# Patient Record
Sex: Male | Born: 1960 | Race: White | Hispanic: No | Marital: Married | Smoking: Never smoker
Health system: Southern US, Community
[De-identification: ages and names within clinical notes are randomized; demographics above are authoritative.]

## PROBLEM LIST (undated history)

## (undated) DIAGNOSIS — I341 Nonrheumatic mitral (valve) prolapse: Secondary | ICD-10-CM

## (undated) DIAGNOSIS — D689 Coagulation defect, unspecified: Secondary | ICD-10-CM

## (undated) DIAGNOSIS — B958 Unspecified staphylococcus as the cause of diseases classified elsewhere: Secondary | ICD-10-CM

## (undated) DIAGNOSIS — N200 Calculus of kidney: Secondary | ICD-10-CM

## (undated) DIAGNOSIS — M1990 Unspecified osteoarthritis, unspecified site: Secondary | ICD-10-CM

## (undated) HISTORY — PX: POLYPECTOMY: SHX149

## (undated) HISTORY — DX: Unspecified osteoarthritis, unspecified site: M19.90

## (undated) HISTORY — DX: Unspecified staphylococcus as the cause of diseases classified elsewhere: B95.8

## (undated) HISTORY — PX: STOMACH SURGERY: SHX791

## (undated) HISTORY — DX: Coagulation defect, unspecified: D68.9

## (undated) HISTORY — PX: LUMBAR LAMINECTOMY: SHX95

## (undated) HISTORY — DX: Calculus of kidney: N20.0

## (undated) HISTORY — PX: APPENDECTOMY: SHX54

## (undated) HISTORY — DX: Nonrheumatic mitral (valve) prolapse: I34.1

## (undated) HISTORY — PX: COLONOSCOPY: SHX174

---
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
## 1996-11-26 ENCOUNTER — Encounter: Payer: Self-pay | Admitting: Gastroenterology

## 1997-01-20 ENCOUNTER — Encounter: Payer: Self-pay | Admitting: Gastroenterology

## 2000-01-03 ENCOUNTER — Ambulatory Visit (HOSPITAL_COMMUNITY): Admission: RE | Admit: 2000-01-03 | Discharge: 2000-01-03 | Payer: Self-pay | Admitting: Family Medicine

## 2000-01-03 ENCOUNTER — Encounter: Payer: Self-pay | Admitting: Family Medicine

## 2000-03-05 ENCOUNTER — Other Ambulatory Visit: Admission: RE | Admit: 2000-03-05 | Discharge: 2000-03-05 | Payer: Self-pay | Admitting: Gastroenterology

## 2000-03-05 ENCOUNTER — Encounter: Payer: Self-pay | Admitting: Gastroenterology

## 2000-03-05 ENCOUNTER — Encounter (INDEPENDENT_AMBULATORY_CARE_PROVIDER_SITE_OTHER): Payer: Self-pay

## 2001-10-14 ENCOUNTER — Encounter: Payer: Self-pay | Admitting: Gastroenterology

## 2001-11-11 ENCOUNTER — Encounter: Payer: Self-pay | Admitting: Neurosurgery

## 2001-11-18 ENCOUNTER — Encounter: Payer: Self-pay | Admitting: Neurosurgery

## 2001-11-18 ENCOUNTER — Ambulatory Visit (HOSPITAL_COMMUNITY): Admission: RE | Admit: 2001-11-18 | Discharge: 2001-11-18 | Payer: Self-pay | Admitting: Neurosurgery

## 2005-10-01 ENCOUNTER — Ambulatory Visit: Payer: Self-pay | Admitting: Gastroenterology

## 2005-10-16 ENCOUNTER — Ambulatory Visit: Payer: Self-pay | Admitting: Gastroenterology

## 2005-10-16 ENCOUNTER — Encounter (INDEPENDENT_AMBULATORY_CARE_PROVIDER_SITE_OTHER): Payer: Self-pay | Admitting: Specialist

## 2006-11-12 HISTORY — PX: LAPAROSCOPIC APPENDECTOMY: SUR753

## 2007-04-22 ENCOUNTER — Ambulatory Visit (HOSPITAL_COMMUNITY): Admission: RE | Admit: 2007-04-22 | Discharge: 2007-04-22 | Payer: Self-pay | Admitting: Family Medicine

## 2007-04-22 ENCOUNTER — Encounter (INDEPENDENT_AMBULATORY_CARE_PROVIDER_SITE_OTHER): Payer: Self-pay | Admitting: Family Medicine

## 2007-04-22 ENCOUNTER — Ambulatory Visit: Payer: Self-pay | Admitting: Vascular Surgery

## 2007-05-08 ENCOUNTER — Ambulatory Visit: Payer: Self-pay | Admitting: Oncology

## 2007-05-14 LAB — CBC WITH DIFFERENTIAL/PLATELET
BASO%: 0.3 % (ref 0.0–2.0)
Basophils Absolute: 0 10*3/uL (ref 0.0–0.1)
EOS%: 2.1 % (ref 0.0–7.0)
Eosinophils Absolute: 0.1 10*3/uL (ref 0.0–0.5)
HGB: 15.1 g/dL (ref 13.0–17.1)
LYMPH%: 22.6 % (ref 14.0–48.0)
MCH: 30.4 pg (ref 28.0–33.4)
MCHC: 35.2 g/dL (ref 32.0–35.9)
MONO#: 0.6 10*3/uL (ref 0.1–0.9)
RBC: 4.98 10*6/uL (ref 4.20–5.71)
RDW: 12.9 % (ref 11.2–14.6)
WBC: 7.2 10*3/uL (ref 4.0–10.0)
lymph#: 1.6 10*3/uL (ref 0.9–3.3)

## 2007-05-14 LAB — MORPHOLOGY: PLT EST: ADEQUATE

## 2007-11-13 HISTORY — PX: LITHOTRIPSY: SUR834

## 2008-04-20 ENCOUNTER — Encounter (INDEPENDENT_AMBULATORY_CARE_PROVIDER_SITE_OTHER): Payer: Self-pay | Admitting: Family Medicine

## 2008-04-20 ENCOUNTER — Ambulatory Visit: Payer: Self-pay | Admitting: Surgery

## 2008-04-20 ENCOUNTER — Ambulatory Visit (HOSPITAL_COMMUNITY): Admission: RE | Admit: 2008-04-20 | Discharge: 2008-04-20 | Payer: Self-pay | Admitting: Family Medicine

## 2009-03-15 ENCOUNTER — Encounter (INDEPENDENT_AMBULATORY_CARE_PROVIDER_SITE_OTHER): Payer: Self-pay | Admitting: General Surgery

## 2009-03-15 ENCOUNTER — Encounter: Admission: RE | Admit: 2009-03-15 | Discharge: 2009-03-15 | Payer: Self-pay | Admitting: Family Medicine

## 2009-03-15 ENCOUNTER — Encounter: Payer: Self-pay | Admitting: Gastroenterology

## 2009-03-15 ENCOUNTER — Inpatient Hospital Stay (HOSPITAL_COMMUNITY): Admission: EM | Admit: 2009-03-15 | Discharge: 2009-03-16 | Payer: Self-pay | Admitting: Emergency Medicine

## 2009-07-20 ENCOUNTER — Encounter: Payer: Self-pay | Admitting: Gastroenterology

## 2009-07-27 ENCOUNTER — Telehealth: Payer: Self-pay | Admitting: Gastroenterology

## 2009-08-04 ENCOUNTER — Ambulatory Visit (HOSPITAL_COMMUNITY): Admission: RE | Admit: 2009-08-04 | Discharge: 2009-08-04 | Payer: Self-pay | Admitting: Urology

## 2009-08-26 ENCOUNTER — Encounter (INDEPENDENT_AMBULATORY_CARE_PROVIDER_SITE_OTHER): Payer: Self-pay | Admitting: *Deleted

## 2009-08-26 ENCOUNTER — Ambulatory Visit: Payer: Self-pay | Admitting: Gastroenterology

## 2009-08-26 DIAGNOSIS — K5120 Ulcerative (chronic) proctitis without complications: Secondary | ICD-10-CM

## 2009-08-26 DIAGNOSIS — R933 Abnormal findings on diagnostic imaging of other parts of digestive tract: Secondary | ICD-10-CM

## 2009-08-26 DIAGNOSIS — D3A026 Benign carcinoid tumor of the rectum: Secondary | ICD-10-CM

## 2009-08-30 ENCOUNTER — Encounter: Admission: RE | Admit: 2009-08-30 | Discharge: 2009-08-30 | Payer: Self-pay | Admitting: Gastroenterology

## 2009-09-02 ENCOUNTER — Ambulatory Visit: Payer: Self-pay | Admitting: Gastroenterology

## 2009-09-02 ENCOUNTER — Encounter: Payer: Self-pay | Admitting: Gastroenterology

## 2009-09-05 ENCOUNTER — Encounter: Payer: Self-pay | Admitting: Gastroenterology

## 2010-02-16 ENCOUNTER — Telehealth: Payer: Self-pay | Admitting: Gastroenterology

## 2010-02-20 ENCOUNTER — Encounter: Admission: RE | Admit: 2010-02-20 | Discharge: 2010-02-20 | Payer: Self-pay | Admitting: Gastroenterology

## 2010-03-10 ENCOUNTER — Encounter: Admission: RE | Admit: 2010-03-10 | Discharge: 2010-03-10 | Payer: Self-pay | Admitting: Neurosurgery

## 2010-07-19 ENCOUNTER — Encounter: Admission: RE | Admit: 2010-07-19 | Discharge: 2010-07-19 | Payer: Self-pay | Admitting: Neurosurgery

## 2010-10-26 IMAGING — CR DG LUMBAR SPINE 2-3V
3 series · 3 of 3 positions shown · non-contrast
Comparison: CT abdomen pelvis of 03/15/2009

CLINICAL DATA: History of lumbar spine surgery in 3335, low back
pain

LUMBAR SPINE - 2-3 VIEW

[w l-spine lat (1 of 2)]
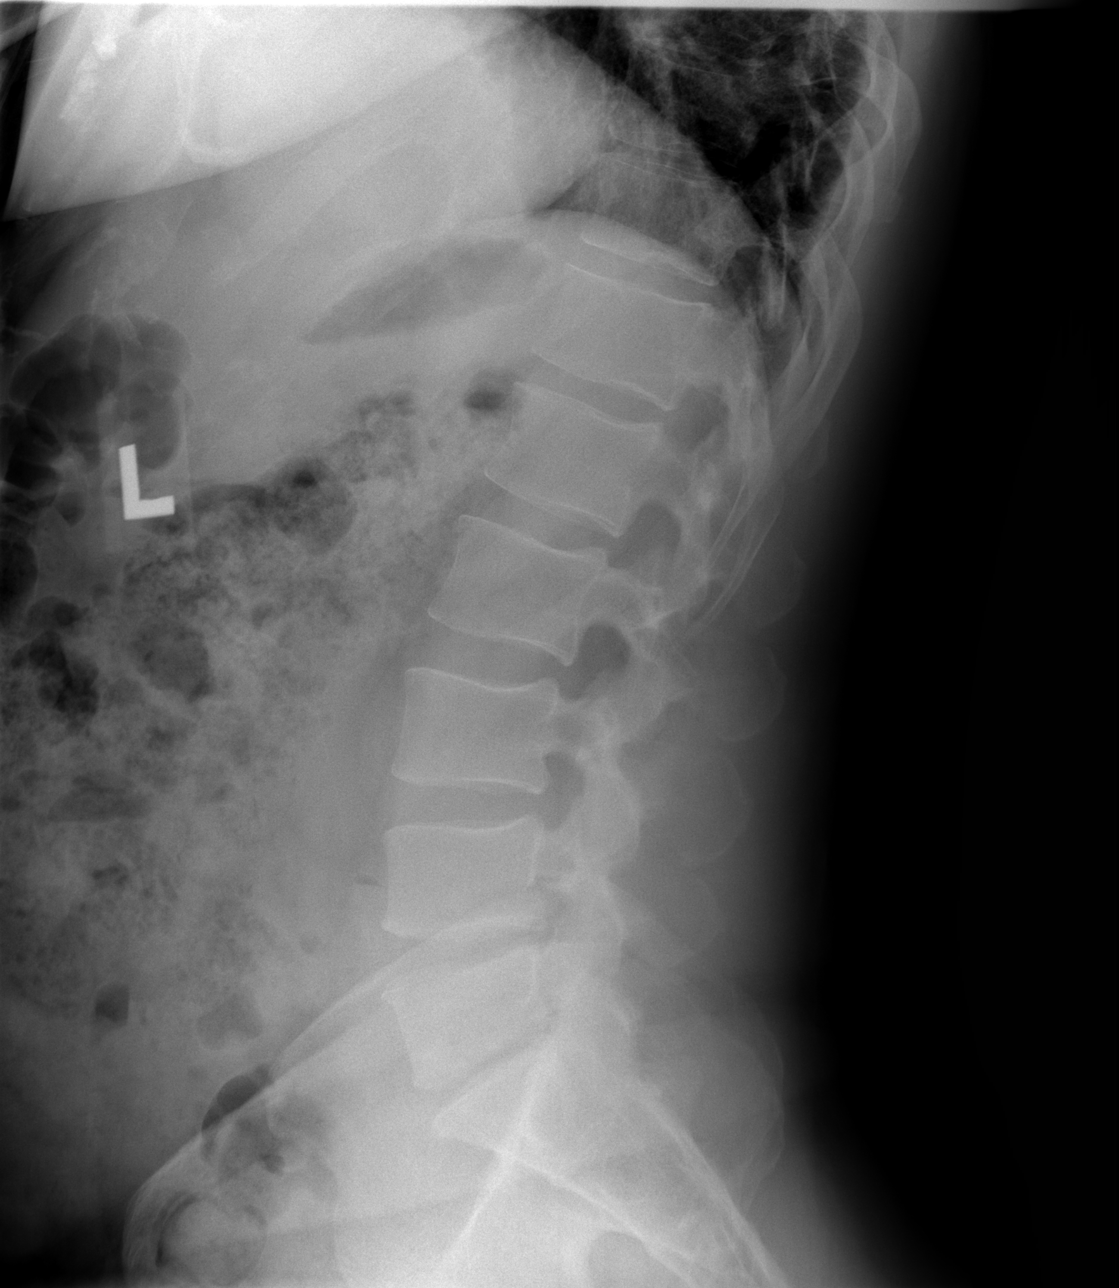

[w l-spine lat *]
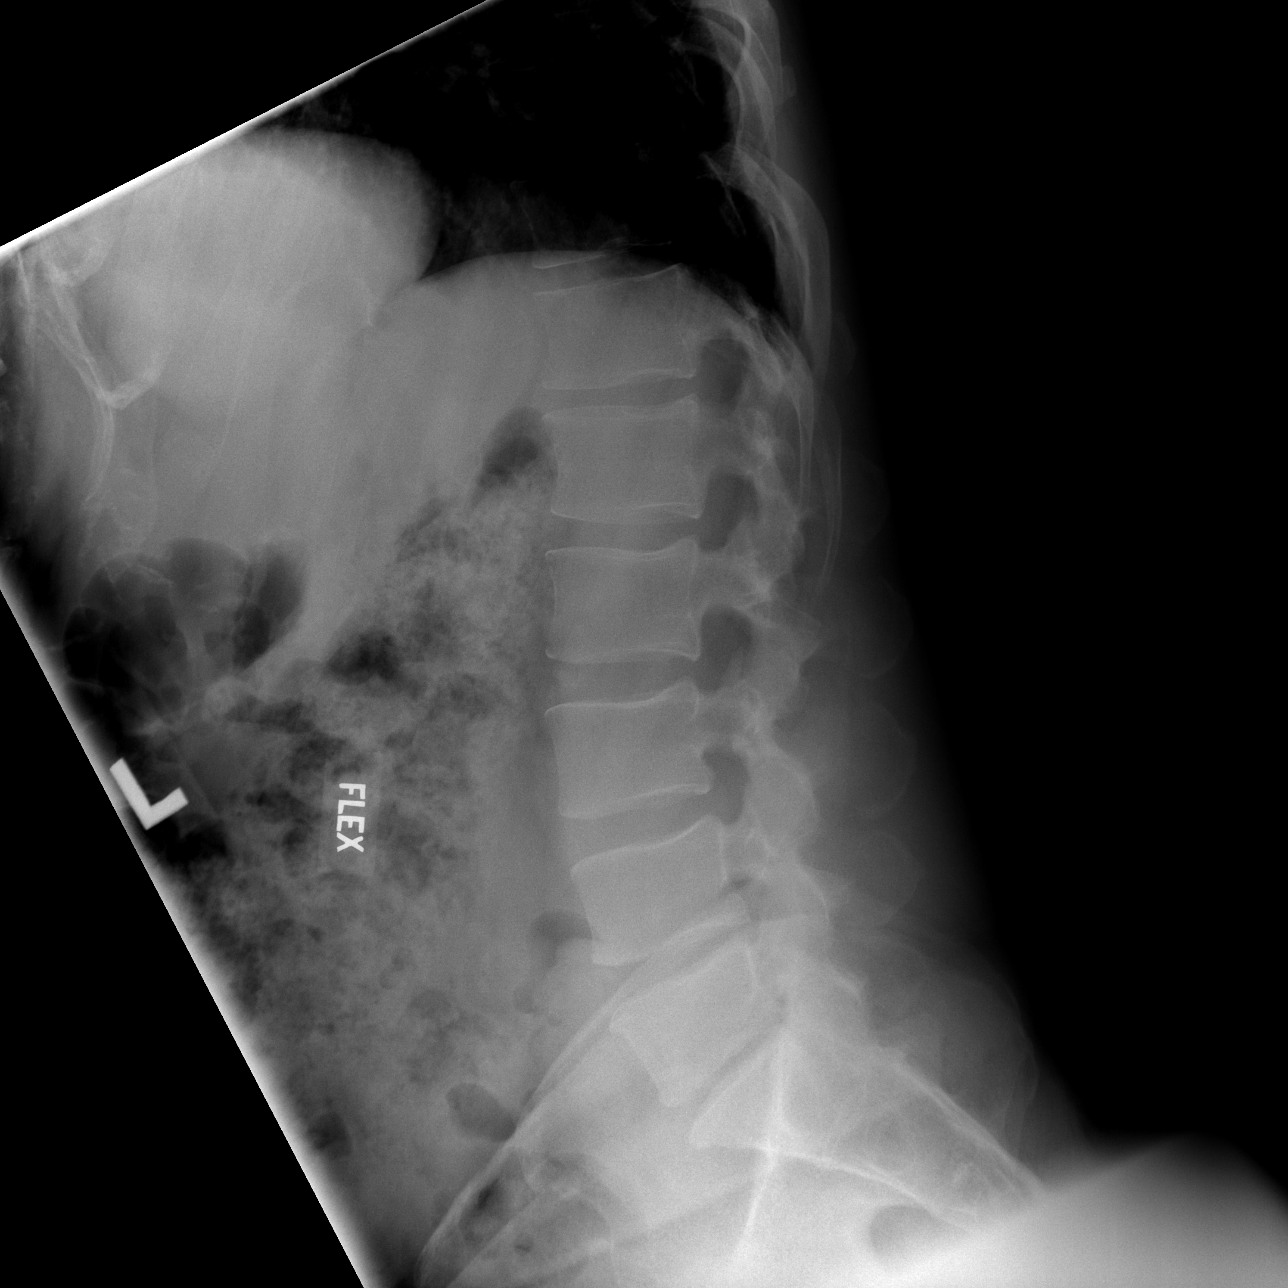

[w l-spine lat (2 of 2)]
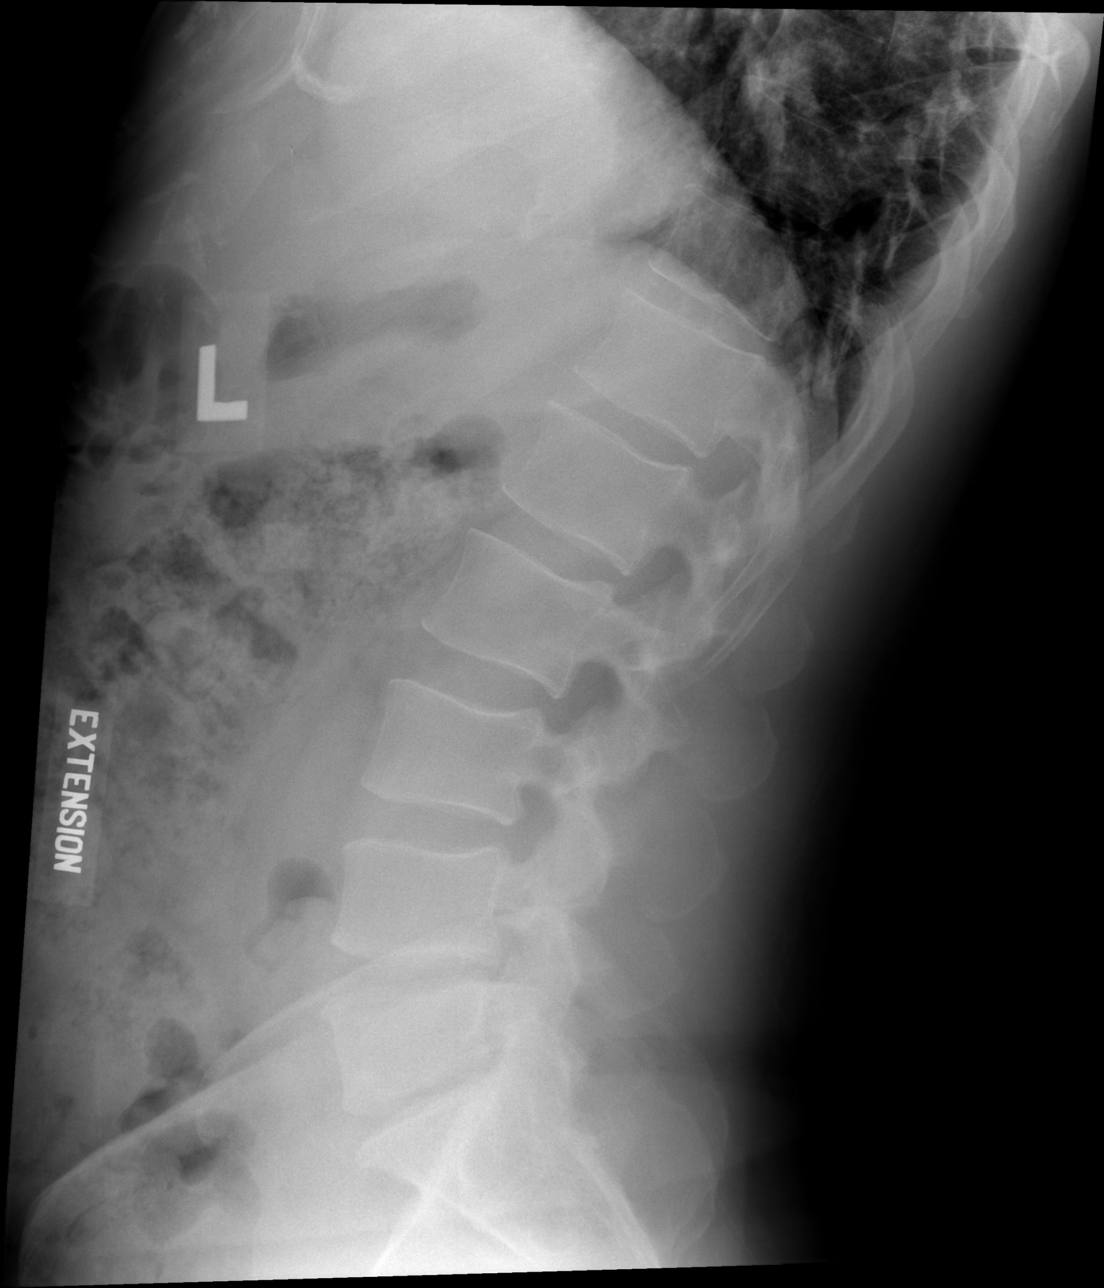

[3 of 3 positions shown; findings below may reference images not displayed]

FINDINGS: The lumbar vertebrae remain normal alignment.  Decreased
disc space at L5-S1 is unchanged.  No compression deformity is
seen.  Through flexion and extension range of motion is limited but
no malalignment is seen.
IMPRESSION: 1.  No change in alignment with decreased disc space remaining at
L5-S1.
2.  Limited range of motion through flexion and extension.  No
malalignment.

## 2010-11-08 ENCOUNTER — Inpatient Hospital Stay (HOSPITAL_COMMUNITY)
Admission: RE | Admit: 2010-11-08 | Discharge: 2010-11-11 | Payer: Self-pay | Source: Home / Self Care | Attending: Neurosurgery | Admitting: Neurosurgery

## 2010-12-12 ENCOUNTER — Encounter
Admission: RE | Admit: 2010-12-12 | Discharge: 2010-12-12 | Payer: Self-pay | Source: Home / Self Care | Attending: Neurosurgery | Admitting: Neurosurgery

## 2010-12-12 NOTE — Progress Notes (Signed)
Summary: Refax order  Phone Note From Other Clinic   Caller: [facility] [phone] Call For: Dr. Russella Dar Summary of Call: Refax ultrasound order  Fax# [phone] Initial call taken by: Karna Christmas,  February 16, 2010 12:12 PM  Follow-up for Phone Call        done Follow-up by: Darcey Nora RN, CGRN,  February 16, 2010 12:16 PM

## 2010-12-21 NOTE — Discharge Summary (Signed)
  [name], John White               ACCOUNT NO.:  [id number]  MEDICAL RECORD NO.:  [id number]          PATIENT TYPE:  INP  LOCATION:  3006                         FACILITY:  [facility]  PHYSICIAN:  Donalee Citrin, M.D.        DATE OF BIRTH:  [birth year]  DATE OF ADMISSION:  11/08/2010 DATE OF DISCHARGE:  11/11/2010                              DISCHARGE SUMMARY   ADMITTING DIAGNOSIS:  Degenerative disk disease, lumbar spinal stenosis L4-5 and L5-S1.  PROCEDURE:  L4-5 and L5 interbody fusion.  HOSPITAL COURSE:  The patient was admitted and immediately went to the operating room and underwent the aforementioned procedure.  Postop, the patient did very well, recovered on the floor.  On the floor, the patient continued to convalesce well, mobilized well with complete resolution of his preoperative leg pain, back pain came and had progressively better control.  He had a mild low-grade temperature and was given incentive spirometer.  His Hemovac was taken out on day 2, and at the time of discharge the patient was ambulating and voiding spontaneously.  He was discharged on oral oxycodone.  We will schedule follow up in approximately 1 week.          ______________________________ Donalee Citrin, M.D.     GC/MEDQ  D:  12/07/2010  T:  12/07/2010  Job:  [id number]  Electronically Signed by Donalee Citrin M.D. on 12/21/2010 08:51:30 AM

## 2011-01-02 ENCOUNTER — Ambulatory Visit
Admission: RE | Admit: 2011-01-02 | Discharge: 2011-01-02 | Disposition: A | Discharge status: Home / Self Care | Payer: Managed Care, Other (non HMO) | Source: Ambulatory Visit | Attending: Neurosurgery | Admitting: Neurosurgery

## 2011-01-02 ENCOUNTER — Other Ambulatory Visit: Payer: Self-pay | Admitting: Neurosurgery

## 2011-01-02 DIAGNOSIS — M545 Low back pain: Secondary | ICD-10-CM

## 2011-01-22 LAB — CBC
HCT: 42.4 % (ref 39.0–52.0)
MCHC: 34.9 g/dL (ref 30.0–36.0)
RDW: 12.8 % (ref 11.5–15.5)

## 2011-01-22 LAB — URINE CULTURE: Culture  Setup Time: 201112300629

## 2011-01-22 LAB — URINALYSIS, ROUTINE W REFLEX MICROSCOPIC
Bilirubin Urine: NEGATIVE
Glucose, UA: NEGATIVE mg/dL
Ketones, ur: NEGATIVE mg/dL
Nitrite: NEGATIVE
Specific Gravity, Urine: 1.016 (ref 1.005–1.030)
pH: 7 (ref 5.0–8.0)

## 2011-01-22 LAB — SURGICAL PCR SCREEN
MRSA, PCR: NEGATIVE
Staphylococcus aureus: POSITIVE — AB

## 2011-01-22 LAB — ABO/RH: ABO/RH(D): A POS

## 2011-02-19 ENCOUNTER — Telehealth: Payer: Self-pay

## 2011-02-19 DIAGNOSIS — R933 Abnormal findings on diagnostic imaging of other parts of digestive tract: Secondary | ICD-10-CM

## 2011-02-19 NOTE — Telephone Encounter (Signed)
Pt is notified that it is time to schedule 1 year follow up ultrasound.  He is scheduled for 03/01/11 8:00.  He is aware

## 2011-02-20 LAB — CBC
HCT: 42.4 % (ref 39.0–52.0)
Hemoglobin: 14.4 g/dL (ref 13.0–17.0)
MCHC: 33.9 g/dL (ref 30.0–36.0)
MCV: 90.1 fL (ref 78.0–100.0)
Platelets: 212 10*3/uL (ref 150–400)
RBC: 4.71 MIL/uL (ref 4.22–5.81)
RDW: 13.1 % (ref 11.5–15.5)
WBC: 7.4 10*3/uL (ref 4.0–10.5)

## 2011-02-20 LAB — URINALYSIS, ROUTINE W REFLEX MICROSCOPIC
Bilirubin Urine: NEGATIVE
Nitrite: NEGATIVE
Specific Gravity, Urine: 1.035 — ABNORMAL HIGH (ref 1.005–1.030)
Urobilinogen, UA: 1 mg/dL (ref 0.0–1.0)
pH: 7.5 (ref 5.0–8.0)

## 2011-02-20 LAB — DIFFERENTIAL
Basophils Absolute: 0 10*3/uL (ref 0.0–0.1)
Basophils Relative: 0 % (ref 0–1)
Eosinophils Absolute: 0 K/uL (ref 0.0–0.7)
Eosinophils Relative: 0 % (ref 0–5)
Lymphocytes Relative: 23 % (ref 12–46)
Lymphs Abs: 1.7 K/uL (ref 0.7–4.0)
Monocytes Absolute: 0.6 K/uL (ref 0.1–1.0)
Monocytes Relative: 9 % (ref 3–12)
Neutro Abs: 5 10*3/uL (ref 1.7–7.7)
Neutrophils Relative %: 68 % (ref 43–77)

## 2011-02-20 LAB — TYPE AND SCREEN
ABO/RH(D): A POS
Antibody Screen: NEGATIVE

## 2011-02-20 LAB — PREPARE RBC (CROSSMATCH)

## 2011-02-20 LAB — ABO/RH: ABO/RH(D): A POS

## 2011-02-20 LAB — URINE MICROSCOPIC-ADD ON

## 2011-02-22 ENCOUNTER — Other Ambulatory Visit (HOSPITAL_COMMUNITY): Payer: Managed Care, Other (non HMO)

## 2011-03-01 ENCOUNTER — Ambulatory Visit (HOSPITAL_COMMUNITY)
Admission: RE | Admit: 2011-03-01 | Discharge: 2011-03-01 | Disposition: A | Discharge status: Home / Self Care | Payer: Managed Care, Other (non HMO) | Source: Ambulatory Visit | Attending: Gastroenterology | Admitting: Gastroenterology

## 2011-03-01 DIAGNOSIS — R933 Abnormal findings on diagnostic imaging of other parts of digestive tract: Secondary | ICD-10-CM

## 2011-03-01 DIAGNOSIS — K824 Cholesterolosis of gallbladder: Secondary | ICD-10-CM | POA: Insufficient documentation

## 2011-03-02 ENCOUNTER — Telehealth: Payer: Self-pay | Admitting: Gastroenterology

## 2011-03-02 NOTE — Telephone Encounter (Signed)
Told pt that Dr. Russella Dar has not reviewed the ultrasound results yet but we will contact him as soon as he has reviewed and signed the report. Did tell him that there was nothing urgent to the report that needed to be addressed. Pt verbalized understanding.

## 2011-03-05 NOTE — Progress Notes (Signed)
I have left a voicemail with the patient with the results and that we will contact him next year to repeat US

## 2011-03-22 ENCOUNTER — Other Ambulatory Visit: Payer: Self-pay | Admitting: Neurosurgery

## 2011-03-22 ENCOUNTER — Ambulatory Visit
Admission: RE | Admit: 2011-03-22 | Discharge: 2011-03-22 | Disposition: A | Discharge status: Home / Self Care | Payer: Managed Care, Other (non HMO) | Source: Ambulatory Visit | Attending: Neurosurgery | Admitting: Neurosurgery

## 2011-03-22 DIAGNOSIS — M25559 Pain in unspecified hip: Secondary | ICD-10-CM

## 2011-03-22 DIAGNOSIS — M545 Low back pain: Secondary | ICD-10-CM

## 2011-03-27 NOTE — H&P (Signed)
NAMEGREGORI, John White               ACCOUNT NO.:  [id number]   MEDICAL RECORD NO.:  [id number]          PATIENT TYPE:  INP   LOCATION:  0114                         FACILITY:  [facility]   PHYSICIAN:  Anselm Pancoast. Weatherly, M.D.DATE OF BIRTH:  [birth year]   DATE OF ADMISSION:  03/15/2009  DATE OF DISCHARGE:                              HISTORY & PHYSICAL   CHIEF COMPLAINT:  Abdominal pain.   HISTORY OF PRESENT ILLNESS:  John White is a 50 year old Caucasian  male who was seen in the [facility] today by Dr. Wynelle Link.  CT obtained  which showed a remarkably enlarged appendix.  The patient states that  approximately a week ago he was awakened on Monday night, seven days  ago, with pain, went back to sleep, felt somewhat better the next day,  but then has had intermittent episodes of kind of cramping, more in the  upper abdomen, and then today the pain has kind of shifted to the lower  abdomen.  The patient's only previous surgery was a pilar myomotomy done  when he was 40 months old and otherwise he is in good health.  The  patient had a white count at [facility] and this was normal and he was  sent in.  He was seen originally by the ER physician and they felt that  he was not really acutely tender in the lower abdomen.  I examined the  patient and I find that he is tender with deep palpation in the right  lower quadrant and feel that he has kind of a chronic appendicitis.   ALLERGIES:  The patient has no allergies.   MEDICATIONS:  He is on no chronic medications.   SOCIAL HISTORY:  He works as Airline pilot for [employer] .   PHYSICAL EXAMINATION:  VITAL SIGNS:  His blood pressure was 128/91,  temperature was 98.4, pulse was 79, respirations 20.  HEENT:  Unremarkable.  LUNGS:  Clear.  CARDIAC:  Normal sinus rhythm.  ABDOMEN:  Abdomen was not muscle guarded in tenderness, but with deep  palpation of the right lower quadrant, he is definitely tender where he  is not on the left  lower quadrant.  He has got a well-healed low  transverse incision from his pilar myotomy as an infant and he is not  tender in the right upper quadrant.  CENTRAL NERVOUS SYSTEM:  Physiologic.   IMPRESSION:  Acute appendicitis, kind of chronic of one week's duration.   PLAN:  Will proceed with a laparoscopic appendectomy.  The patient has  been given 3 g of Zosyn with 3.375 g of Zosyn while in the ER.     Anselm Pancoast. Zachery Dakins, M.D.  Electronically Signed    WJW/MEDQ  D:  03/15/2009  T:  03/15/2009  Job:  [id number]

## 2011-03-27 NOTE — Op Note (Signed)
NAMEAUBERY, DOUTHAT               ACCOUNT NO.:  [id number]   MEDICAL RECORD NO.:  [id number]          PATIENT TYPE:  INP   LOCATION:  0114                         FACILITY:  [facility]   PHYSICIAN:  Anselm Pancoast. Weatherly, M.D.DATE OF BIRTH:  1961-10-21   DATE OF PROCEDURE:  03/15/2009  DATE OF DISCHARGE:                               OPERATIVE REPORT   PREOPERATIVE DIAGNOSIS:  Acute appendicitis.   POSTOPERATIVE DIAGNOSIS:  Acute appendicitis.   OPERATION PERFORMED:  Laparoscopic appendectomy.   SURGEON:  Anselm Pancoast. Zachery Dakins, M.D.   ANESTHESIA:  General.   INDICATIONS FOR PROCEDURE:  John White is a 50 year old male who  was referred in by [facility] today by Dr. Wynelle Link after she obtained a CT  and it showed a markedly enlarged appendix in the right lower quadrant.  His white count is normal.  He said he had pain approximately a week  ago.  It lessened off on Tuesday and he has had intermittent episodes of  pain, kind of epigastric around the umbilicus throughout the week and  today the pain was getting worse and he saw Dr. Wynelle Link.  The patient had a  CT that shows a markedly swollen appendix in the right lower quadrant  and not a lot of periappendiceal stranding.  On physical exam, I found  that he is tender in the right lower quadrant with deep palpation.  He  was given 3 g of Zosyn and permission obtained for laparoscopic  appendectomy.  The patient has had a previous pyloromyotomy when he was  about 76 months old.  This is his only previous abdominal surgery.   DESCRIPTION OF PROCEDURE:  The patient was taken to the operating suite,  had induction of general anesthesia and after induction of general  anesthesia, had a Foley catheter placed sterilely in the bladder.  The  abdomen was clipped around the umbilicus, left lower quadrant and right  lateral for the ports and then prepped with Betadine solution and draped  in a sterile manner.  A small incision was made just above  the  umbilicus.  Fascia identified and picked up between two Kochers and then  carefully opened into the peritoneal cavity.  A pursestring suture of 0  Vicryl was placed and a Hasson cannula introduced.  You could see the  appendix and it is swollen and I placed a 5 mm trocar under direct  vision just below where he has had the pyloromyotomy and then placed an  11 mm trocar in the left lower quadrant.  With the camera in the left  lower quadrant, I could then grasp the appendix, elevate it and could  see that it is markedly enlarged and a very long appendix and then could  go across the mesentery where it sort of joins with the cecum.  The  vessels going to the appendix were quite hypertrophied or prominent but  the Harmonic scalpel coagulated them nicely.  One little artery, had  coagulated it, a little closer to the base and then actually divided  distally with kind of a double coagulation.  The appendiceal fatty  tissue was then further divided.  Good hemostasis and then you could see  the junction of the markedly enlarged appendix with the base of the  cecum about 2 cm from the base of the cecum.  A  GIA stapler,  laparoscopic with the vascular load was placed right at the junction of  the appendix cecum, fired and then the appendix placed in an EndoCatch  bag.  Inspection of the suture site and where it had been coagulated  looks nice and then we withdrew the appendix in the EndoCatch bag and  reinserted the Hasson cannula.  We then thoroughly irrigated, aspirated  and a few staples had dropped down and I retrieved these and satisfied  with the hemostasis.  The 11 mm port in the left lower quadrant  withdrawn, the 5 mm port withdrawn and then I removed the Hasson, placed  a figure-of-eight of 0 Vicryl and tied both it and the pursestring.  I  anesthetized the fascia of the umbilicus at this time.  The subcutaneous  wounds were  closed with 4-0 Vicryl, I did put a single stitch in the  fascia in the  left lower quadrant external fascia and then  subcuticular 4-0 Vicryl,  benzoin and Steri-Strips on the skin.  The patient tolerated the  procedure nicely and was sent to the recovery room after the Foley  catheter had been removed.      Anselm Pancoast. Zachery Dakins, M.D.  Electronically Signed     WJW/MEDQ  D:  03/15/2009  T:  03/16/2009  Job:  [id number]   cc:   Deatra James, M.D.  Fax: [phone]

## 2011-03-30 NOTE — Op Note (Signed)
[facility]  Patient:    John White, John White Visit Number: [id number] MRN: [id number]          Service Type: DSU Location: [facility] 3172 03 Attending Physician:  Mariam Dollar Dictated by:   Garlon Hatchet., M.D. Proc. 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  PROCEDURE:  Lumbar laminectomy and microdiskectomy, L5-S1, with microscopic dissection of the right S1 nerve root and microscopic diskectomy.  SURGEON:  Garlon Hatchet., M.D.  ASSISTANT:  Julio Sicks, M.D.  ANESTHESIA:  General endotracheal.  CLINICAL NOTE:  The patient is a very pleasant 50 year old gentleman who has had long-standing back and right leg pain radiating down to the bottom of his foot with numbness and tingling in the same distribution.  The patient has failed conservative treatment.  Preoperative imaging shows a very large disk rupture at L5-S1 compressing the right S1 nerve root.  The patient was extensively counseled of the risks and benefits of lumbar microdiskectomy, and he has decided to proceed forward.  DESCRIPTION OF PROCEDURE:  The patient was brought in the OR, was induced under general anesthesia, positioned prone on the Wilson frame.  His back was prepped and draped in the usual sterile fashion with preoperative x-ray to localize the needle over the L5-S1 disk space.  A midline incision was made extending over this after infiltration of 10 cc of lidocaine with epinephrine. Bovie electrocautery was used to take this down through the subcutaneous tissue, and the fascia was then divided on the patients right side and subperiosteal dissection was carried out in the lamina of L5 and S1. Intraoperative x-ray confirmed localization of the L5-S1 disk space.  Then using a high-speed drill the inferior aspect of the lamina of L5 and the medial  aspect of the facet complex at L5-S1 was drilled down and using a 3 and 4 mm Kerrison punch, the remainder of the inferior aspect of the lamina was removed, exposing the ligamentum flavum, which was removed in piecemeal fashion.  Then the thecal sac was visualized and exposed and the proximal aspect of the S1 nerve root was exposed.  The operating microscope was draped, brought into the field, and under microscopic illumination the remainder of the S1 nerve root was decompressed out its foramen, and it was noted to be displaced dorsally from a large bulbous fragment underneath.  This was teased away using a 4 Penfield, and a DErrico nerve root retractor was used to reflect the S1 nerve root medially.  Using a blunt nerve hook, this large herniated subligamentous disk fragment was teased out from underneath the S1 nerve root and removed with pituitary rongeurs, and then annulotomy was extended with an 11 blade scalpel and the remainder of the disk space was adequately cleaned out.  At the end of the diskectomy there were no further fragments noted both centrally as well as in the disk space or compressing the thecal sac or S1 nerve root.  End plates were scraped off with downgoing Epstein curette, and no further fragments were appreciated.  The wound was copiously irrigated and meticulous hemostasis was maintained.  Gelfoam was overlaid on top of the dura, and the fascia was closed with 0 interrupted Vicryl, the subcutaneous tissue was closed with  2-0 interrupted Vicryl, and the skin was closed with a running 4-0 subcuticular.  Benzoin and Steri-Strips were applied.  The patient went to the recovery room in stable condition.  At the end of the case, all needle counts and sponge counts were correct. Dictated by:   Garlon Hatchet., M.D. Attending Physician:  Mariam Dollar DD:  11/18/01 TD:  11/18/01 Job: [id number]

## 2011-06-18 ENCOUNTER — Other Ambulatory Visit: Payer: Self-pay | Admitting: Dermatology

## 2011-08-30 LAB — BETA-2-GLYCOPROTEIN I ABS, IGG/M/A
Beta-2 Glyco I IgG: <4 U/mL (ref <20)
Beta-2-Glycoprotein I IgA: <4 U/mL (ref <10)
Beta-2-Glycoprotein I IgM: <4 U/mL (ref <10)

## 2011-08-30 LAB — CARDIOLIPIN ANTIBODIES, IGG, IGM, IGA: Anticardiolipin IgG: <7 — ABNORMAL LOW (ref <11)

## 2011-08-30 LAB — LUPUS ANTICOAGULANT PANEL: Lupus Anticoagulant: NOT DETECTED

## 2011-08-30 LAB — FACTOR 5 LEIDEN

## 2011-08-30 LAB — PROTEIN S ACTIVITY: Protein S Activity: 124 % (ref 81–180)

## 2011-08-30 LAB — PROTEIN C, TOTAL: Protein C, Total: 79 % (ref 70–140)

## 2011-08-30 LAB — PROTHROMBIN GENE MUTATION

## 2011-08-30 LAB — ANTITHROMBIN III: AntiThromb III Func: 104

## 2011-09-21 ENCOUNTER — Encounter: Payer: Self-pay | Admitting: Gastroenterology

## 2011-11-16 ENCOUNTER — Encounter: Payer: Self-pay | Admitting: Gastroenterology

## 2011-12-14 ENCOUNTER — Encounter: Payer: Managed Care, Other (non HMO) | Admitting: Gastroenterology

## 2011-12-17 ENCOUNTER — Other Ambulatory Visit: Payer: Self-pay | Admitting: Dermatology

## 2012-02-12 ENCOUNTER — Telehealth: Payer: Self-pay

## 2012-02-12 DIAGNOSIS — K824 Cholesterolosis of gallbladder: Secondary | ICD-10-CM

## 2012-02-12 NOTE — Telephone Encounter (Signed)
I have left a message for the patient to call back to schedule a follow up US to eval gallbladder polyp.

## 2012-02-12 NOTE — Telephone Encounter (Signed)
Message copied by Annett Fabian on Tue Feb 12, 2012  4:57 PM ------      Message from: Annett Fabian      Created: Mon Mar 05, 2011 10:53 AM       Needs follow up US gallbladder polyp

## 2012-02-18 NOTE — Telephone Encounter (Signed)
Patient advised of [place] scheduled for 03/05/12 9:00 [facility].  He is advised to be NPO after midnight

## 2012-03-05 ENCOUNTER — Ambulatory Visit (HOSPITAL_COMMUNITY)
Admission: RE | Admit: 2012-03-05 | Discharge: 2012-03-05 | Disposition: A | Discharge status: Home / Self Care | Payer: Managed Care, Other (non HMO) | Source: Ambulatory Visit | Attending: Gastroenterology | Admitting: Gastroenterology

## 2012-03-05 DIAGNOSIS — K824 Cholesterolosis of gallbladder: Secondary | ICD-10-CM

## 2012-07-21 ENCOUNTER — Ambulatory Visit (HOSPITAL_COMMUNITY)
Admission: RE | Admit: 2012-07-21 | Discharge: 2012-07-21 | Disposition: A | Discharge status: Home / Self Care | Payer: Managed Care, Other (non HMO) | Source: Ambulatory Visit | Attending: Family Medicine | Admitting: Family Medicine

## 2012-07-21 ENCOUNTER — Other Ambulatory Visit: Payer: Self-pay | Admitting: Family Medicine

## 2012-07-21 DIAGNOSIS — M79606 Pain in leg, unspecified: Secondary | ICD-10-CM

## 2012-07-21 DIAGNOSIS — M79609 Pain in unspecified limb: Secondary | ICD-10-CM

## 2012-07-21 DIAGNOSIS — I80299 Phlebitis and thrombophlebitis of other deep vessels of unspecified lower extremity: Secondary | ICD-10-CM | POA: Insufficient documentation

## 2012-07-21 DIAGNOSIS — M7989 Other specified soft tissue disorders: Secondary | ICD-10-CM

## 2012-07-21 NOTE — Progress Notes (Signed)
VASCULAR LAB PRELIMINARY  PRELIMINARY  PRELIMINARY  PRELIMINARY  Right lower extremity venous duplex completed.    Preliminary report:  Superficial thrombosis noted in the GSV from prox-mid calf to the ankle.  No DVT noted.     Itsel Opfer, RVT 07/21/2012, 12:47 PM

## 2012-08-13 ENCOUNTER — Encounter: Payer: Self-pay | Admitting: Gastroenterology

## 2012-11-19 ENCOUNTER — Encounter: Payer: Self-pay | Admitting: Gastroenterology

## 2012-12-16 ENCOUNTER — Ambulatory Visit (AMBULATORY_SURGERY_CENTER): Payer: Managed Care, Other (non HMO) | Admitting: *Deleted

## 2012-12-16 ENCOUNTER — Encounter: Payer: Self-pay | Admitting: Gastroenterology

## 2012-12-16 VITALS — Ht 73.0 in | Wt 223.0 lb

## 2012-12-16 DIAGNOSIS — Z1211 Encounter for screening for malignant neoplasm of colon: Secondary | ICD-10-CM

## 2012-12-16 MED ORDER — MOVIPREP 100 G PO SOLR: ORAL | Status: DC

## 2012-12-30 ENCOUNTER — Encounter: Payer: Managed Care, Other (non HMO) | Admitting: Gastroenterology

## 2013-01-01 ENCOUNTER — Encounter: Payer: Self-pay | Admitting: Gastroenterology

## 2013-01-01 ENCOUNTER — Ambulatory Visit (AMBULATORY_SURGERY_CENTER): Payer: Managed Care, Other (non HMO) | Admitting: Gastroenterology

## 2013-01-01 VITALS — BP 100/76 | HR 75 | Temp 97.6°F | Resp 17 | Ht 73.0 in | Wt 223.0 lb

## 2013-01-01 DIAGNOSIS — K5190 Ulcerative colitis, unspecified, without complications: Secondary | ICD-10-CM

## 2013-01-01 DIAGNOSIS — Z800 Family history of malignant neoplasm of digestive organs: Secondary | ICD-10-CM

## 2013-01-01 DIAGNOSIS — K5289 Other specified noninfective gastroenteritis and colitis: Secondary | ICD-10-CM

## 2013-01-01 DIAGNOSIS — Z1211 Encounter for screening for malignant neoplasm of colon: Secondary | ICD-10-CM

## 2013-01-01 MED ORDER — SODIUM CHLORIDE 0.9 % IV SOLN: 500.0000 mL | INTRAVENOUS | Status: DC

## 2013-01-01 NOTE — Progress Notes (Signed)
Patient did not experience any of the following events: a burn prior to discharge; a fall within the facility; wrong site/side/patient/procedure/implant event; or a hospital transfer or hospital admission upon discharge from the facility. (G8907) Patient did not have preoperative order for IV antibiotic SSI prophylaxis. (G8918)  

## 2013-01-01 NOTE — Patient Instructions (Addendum)
Findings:  Patchy colitis otherwise normal. Recommendations:  Wait for pathology results, call to schedule an appointment to see Dr Russella Dar in office in 2 weeks, colonoscopy in 2 years., 5 ASA long term.  YOU HAD AN ENDOSCOPIC PROCEDURE TODAY AT THE [facility]: Refer to the procedure report that was given to you for any specific questions about what was found during the examination.  If the procedure report does not answer your questions, please call your gastroenterologist to clarify.  If you requested that your care partner not be given the details of your procedure findings, then the procedure report has been included in a sealed envelope for you to review at your convenience later.  YOU SHOULD EXPECT: Some feelings of bloating in the abdomen. Passage of more gas than usual.  Walking can help get rid of the air that was put into your GI tract during the procedure and reduce the bloating. If you had a lower endoscopy (such as a colonoscopy or flexible sigmoidoscopy) you may notice spotting of blood in your stool or on the toilet paper. If you underwent a bowel prep for your procedure, then you may not have a normal bowel movement for a few days.  DIET: Your first meal following the procedure should be a light meal and then it is ok to progress to your normal diet.  A half-sandwich or bowl of soup is an example of a good first meal.  Heavy or fried foods are harder to digest and may make you feel nauseous or bloated.  Likewise meals heavy in dairy and vegetables can cause extra gas to form and this can also increase the bloating.  Drink plenty of fluids but you should avoid alcoholic beverages for 24 hours.  ACTIVITY: Your care partner should take you home directly after the procedure.  You should plan to take it easy, moving slowly for the rest of the day.  You can resume normal activity the day after the procedure however you should NOT DRIVE or use heavy machinery for 24 hours (because of  the sedation medicines used during the test).    SYMPTOMS TO REPORT IMMEDIATELY: A gastroenterologist can be reached at any hour.  During normal business hours, 8:30 AM to 5:00 PM Monday through Friday, call [phone].  After hours and on weekends, please call the GI answering service at [phone] who will take a message and have the physician on call contact you.   Following lower endoscopy (colonoscopy or flexible sigmoidoscopy):  Excessive amounts of blood in the stool  Significant tenderness or worsening of abdominal pains  Swelling of the abdomen that is new, acute  Fever of 100F or higher  Following upper endoscopy (EGD)  Vomiting of blood or coffee ground material  New chest pain or pain under the shoulder blades  Painful or persistently difficult swallowing  New shortness of breath  Fever of 100F or higher  Black, tarry-looking stools  FOLLOW UP: If any biopsies were taken you will be contacted by phone or by letter within the next 1-3 weeks.  Call your gastroenterologist if you have not heard about the biopsies in 3 weeks.  Our staff will call the home number listed on your records the next business day following your procedure to check on you and address any questions or concerns that you may have at that time regarding the information given to you following your procedure. This is a courtesy call and so if there is no answer at the  home number and we have not heard from you through the emergency physician on call, we will assume that you have returned to your regular daily activities without incident.  SIGNATURES/CONFIDENTIALITY: You and/or your care partner have signed paperwork which will be entered into your electronic medical record.  These signatures attest to the fact that that the information above on your After Visit Summary has been reviewed and is understood.  Full responsibility of the confidentiality of this discharge information lies with you and/or your  care-partner.  Please follow all discharge instructions given to you by the recovery room nurse. If you have any questions or problems after discharge please call one of the numbers listed above. You will receive a phone call in the am to see how you are doing and answer any questions you may have. Thank you for choosing [facility] for your health care needs.

## 2013-01-01 NOTE — Op Note (Signed)
[facility] [address] [place], [address]   COLONOSCOPY PROCEDURE REPORT  PATIENT: John White, John White  MR#: [id number] BIRTHDATE: [birth year] , 52  yrs. old GENDER: Male ENDOSCOPIST: Meryl Dare, MD, [facility] PROCEDURE DATE:  01/01/2013 PROCEDURE:   Colonoscopy with biopsy ASA CLASS:   Class II INDICATIONS:elevated risk screening, patient's immediate family history of colon cancer, and high risk patient with previously diagnosed UC. MEDICATIONS: MAC sedation, administered by CRNA and propofol (Diprivan) 350mg  IV DESCRIPTION OF PROCEDURE:   After the risks benefits and alternatives of the procedure were thoroughly explained, informed consent was obtained.  A digital rectal exam revealed no abnormalities of the rectum.   The LB CF-[id number]  endoscope was introduced through the anus and advanced to the cecum, which was identified by both the appendix and ileocecal valve. No adverse events experienced.   The quality of the prep was good, using MoviPrep  The instrument was then slowly withdrawn as the colon was fully examined.  COLON FINDINGS: Abnormal mucosa was found in the distal transverse colon, at the splenic flexure, in the descending colon, sigmoid colon, and rectum.  The mucosa was erythematous and had granularity. More involvement in the sigmoid and descending colon. Multiple random biopsies of the area were performed.   The colon was otherwise normal.  There was no diverticulosis, inflammation, polyps or cancers unless previously stated.  Random biopsies taken in the ascending colon, hepatic flexure and proximal transverse colonl. Retroflexed views revealed no abnormalities. The time to cecum=4 minutes 44 seconds.  Withdrawal time=9 minutes 09 seconds. The scope was withdrawn and the procedure completed.  COMPLICATIONS: There were no complications.  ENDOSCOPIC IMPRESSION: 1.   Patchy colitis; multiple random biopsies 2.   The colon was  otherwise normal  RECOMMENDATIONS: 1.  Await pathology results 2.  Call to schedule a follow-up appointment with GI Clinic 2 week(s) 3.  5 ASA long term 4.  Colonoscopy in 2 years  eSigned:  Meryl Dare, MD, Clementeen Graham 01/01/2013 9:12 AM   cc: Merri Brunette, MD

## 2013-01-01 NOTE — Progress Notes (Signed)
Called to room to assist during endoscopic procedure.  Patient ID and intended procedure confirmed with present staff. Received instructions for my participation in the procedure from the performing physician.  

## 2013-01-02 ENCOUNTER — Telehealth: Payer: Self-pay | Admitting: *Deleted

## 2013-01-02 ENCOUNTER — Telehealth: Payer: Self-pay

## 2013-01-02 MED ORDER — MESALAMINE 1.2 G PO TBEC: 2.4000 g | DELAYED_RELEASE_TABLET | Freq: Every day | ORAL | Status: DC

## 2013-01-02 NOTE — Telephone Encounter (Signed)
Message copied by Jessee Avers on Fri Jan 02, 2013  9:45 AM ------      Message from: Claudette Head T      Created: Thu Jan 01, 2013 10:15 AM       This patient had colonoscopy today and needs to start on a 5ASA medication. He is agreeable to start this. Please see what is best covered by his insurance. He would prefer Lialda or Apriso for easy dosing. If he starts on this we can change his follow up appt to 1 year instead of 2 weeks that I listed on colonoscopy report. ------

## 2013-01-02 NOTE — Telephone Encounter (Signed)
Left a message for patient to return my call. 

## 2013-01-02 NOTE — Telephone Encounter (Signed)
Lialda 2.4 g daily for 1 year REV in 1 years

## 2013-01-02 NOTE — Telephone Encounter (Signed)
Either one is on his formulary. Which one do you want him to start and what is the dosing?

## 2013-01-02 NOTE — Telephone Encounter (Signed)
No answer, name identifier, message left to call if any questions or concerns.  

## 2013-01-02 NOTE — Telephone Encounter (Signed)
Patient notified and prescription sent to pharmacy. Pt notified he needs a follow up in year unless he is having problems.  Told patient to also call back if this medication is too expensive because it looks like Apriso and Lialda are both on his formulary. Pt agreed and verbalized understanding.

## 2013-01-02 NOTE — Telephone Encounter (Signed)
Message copied by Jessee Avers on Fri Jan 02, 2013  9:47 AM ------      Message from: Claudette Head T      Created: Thu Jan 01, 2013 10:15 AM       This patient had colonoscopy today and needs to start on a 5ASA medication. He is agreeable to start this. Please see what is best covered by his insurance. He would prefer Lialda or Apriso for easy dosing. If he starts on this we can change his follow up appt to 1 year instead of 2 weeks that I listed on colonoscopy report. ------

## 2013-01-02 NOTE — Telephone Encounter (Signed)
Either one is on her formulary. Which one would you like him to start and what is the dosing?

## 2013-01-02 NOTE — Telephone Encounter (Signed)
Error

## 2013-01-07 ENCOUNTER — Encounter: Payer: Self-pay | Admitting: Gastroenterology

## 2013-04-21 ENCOUNTER — Encounter: Payer: Self-pay | Admitting: Internal Medicine

## 2013-04-21 ENCOUNTER — Ambulatory Visit (INDEPENDENT_AMBULATORY_CARE_PROVIDER_SITE_OTHER): Payer: Managed Care, Other (non HMO) | Admitting: Internal Medicine

## 2013-04-21 VITALS — BP 120/78 | HR 92 | Temp 98.0°F | Wt 218.0 lb

## 2013-04-21 DIAGNOSIS — B356 Tinea cruris: Secondary | ICD-10-CM

## 2013-04-21 MED ORDER — CLOTRIMAZOLE 1 % EX CREA: TOPICAL_CREAM | Freq: Two times a day (BID) | CUTANEOUS | Status: DC

## 2013-04-21 NOTE — Progress Notes (Signed)
Patient ID: John White, male   DOB: 05-09-1961, 52 y.o.   MRN: [id number]         [facility]  Reason for Consult: MRSA skin infection Referring Physician: Dr. Karlyn Agee  Patient Active Problem List   Diagnosis Date Noted  . Tinea cruris 04/21/2013  . 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 ULCERATIVE PROCTITIS 08/26/2009  . NONSPECIFIC ABN FINDING RAD & OTH EXAM GI TRACT 08/26/2009    Patient's Medications  New Prescriptions   CLOTRIMAZOLE (LOTRIMIN) 1 % CREAM    Apply topically 2 (two) times daily.  Previous Medications   DOXYCYCLINE (DORYX) 100 MG DR CAPSULE    Take 100 mg by mouth 2 (two) times daily.   MESALAMINE (LIALDA) 1.2 G EC TABLET    Take 2 tablets (2.4 g total) by mouth daily with breakfast.  Modified Medications   No medications on file  Discontinued Medications   No medications on file    Recommendations: 1. Complete 1 more day of oral doxycycline 2. Clotrimazole 1% cream for groin rash   Assessment: He is improving on doxycycline for MRSA skin infection primarily involving his right hand. I suspect that he has wife are both colonized with MRSA. I suggested that he completed his doxycycline and focus on hand hygiene. I've given him written information about MRSA skin infections. I do not think he needs decolonization at this point given this is only his first episode.  He probably also has a fungal groin infection do to tinea cruris or Candida. I will treat him with clotrimazole cream.   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 Karlyn Agee for an MRSA skin infection. His wife has been bothered by intermittent boils over the past year  which she has treated at home. He developed a left buttock boil about 2 weeks ago which resolved spontaneously. One week ago he started having redness, pain and swelling of his right hand. He also noted some scattered lesions on his upper  back and right hip. The hand swelling got severe enough that he saw Dr. Yetta Barre on June 3. A culture of some drainage from the lesion on the dorsum of his right hand grew MRSA. He was started on doxycycline and has been improving steadily. He has not had any new lesions over the past week. He has not had any fever, chills or sweats.  His also been bothered by a slightly itchy rash in his groin over the past several months. He is tried antibacterial and anti-itch creams without any relief. He and his wife have been walking up to 12 miles daily as part of a contest through his work and he has been sweating a great deal which he seems to think aggravates this groin rash.  He takes Haldol for ulcerative colitis which is under good control.  Review of Systems: Constitutional: negative Eyes: negative Ears, nose, mouth, throat, and face: negative Respiratory: negative Cardiovascular: negative Gastrointestinal: negative Genitourinary:negative Integument/breast: positive for skin lesion(s), negative for dryness    Past Medical History  Diagnosis Date  . Mitral valve prolapse     History  Substance Use Topics  . Smoking status: Never Smoker   . Smokeless tobacco: Never Used  . Alcohol Use: No    Family History  Problem Relation Age of Onset  . Colon cancer Father 57  . Stomach cancer Neg Hx    No  Known Allergies  OBJECTIVE: Blood pressure 120/78, pulse 92, temperature 98 F (36.7 C), temperature source Oral, weight 218 lb (98.884 kg). General: He is in good spirits and in no distress Skin: He has some nodular skin lesions on his right hand with erythema and some superficial desquamation. There is no fluctuance or drainage. He notes that the lesions are markedly improved over the last week. Lungs: Clear Cor: Regular S1 and S2 no murmurs Abdomen: Soft and nontender Joints and extremities: Normal Groin: He has a slightly erythematous maculopapular rash with satellite lesions in both  inguinal creases  Microbiology: No results found for this or any previous visit (from the past 240 hour(s)).  Cliffton Asters, MD [facility] [phone] pager   [phone] cell 04/21/2013, 10:17 AM

## 2013-06-05 ENCOUNTER — Other Ambulatory Visit: Payer: Self-pay | Admitting: Dermatology

## 2014-03-01 ENCOUNTER — Telehealth: Payer: Self-pay

## 2014-03-01 NOTE — Telephone Encounter (Signed)
Patient on a high deductible plan and needs to have some other testing done first.  He will call me back to set up [place] when he has completed his other medical tests

## 2014-03-01 NOTE — Telephone Encounter (Signed)
Message copied by Marlon Pel on Mon Mar 01, 2014 10:48 AM ------      Message from: Marlon Pel      Created: Thu Mar 06, 2012  1:59 PM       Needs US gallbladder to follow up gallbladder polyp ------

## 2014-03-01 NOTE — Telephone Encounter (Signed)
Left message for patient to call back to discuss scheduling [place]

## 2014-10-22 ENCOUNTER — Telehealth: Payer: Self-pay | Admitting: Gastroenterology

## 2014-10-22 DIAGNOSIS — K824 Cholesterolosis of gallbladder: Secondary | ICD-10-CM

## 2014-10-22 NOTE — Telephone Encounter (Signed)
Left message for patient to call back Orders placed.  Needs follow up US for gallbladder polyp

## 2014-10-26 NOTE — Telephone Encounter (Signed)
Patient is scheduled for 11/01/14 11:00 at [facility].  His wife has asked me to set this up for next week.  Left message for patient to call back

## 2014-10-27 NOTE — Telephone Encounter (Signed)
Patient notified of the appt date and time and instructions

## 2014-11-01 ENCOUNTER — Ambulatory Visit (HOSPITAL_COMMUNITY): Payer: Managed Care, Other (non HMO)

## 2014-11-01 ENCOUNTER — Ambulatory Visit (HOSPITAL_COMMUNITY)
Admission: RE | Admit: 2014-11-01 | Discharge: 2014-11-01 | Disposition: A | Discharge status: Home / Self Care | Payer: Managed Care, Other (non HMO) | Source: Ambulatory Visit | Attending: Gastroenterology | Admitting: Gastroenterology

## 2014-11-01 DIAGNOSIS — K824 Cholesterolosis of gallbladder: Secondary | ICD-10-CM | POA: Insufficient documentation

## 2014-12-08 ENCOUNTER — Encounter: Payer: Self-pay | Admitting: Gastroenterology

## 2015-01-04 ENCOUNTER — Encounter: Payer: Self-pay | Admitting: Gastroenterology

## 2015-02-22 ENCOUNTER — Ambulatory Visit (AMBULATORY_SURGERY_CENTER): Payer: Self-pay | Admitting: *Deleted

## 2015-02-22 VITALS — Ht 73.0 in | Wt 211.0 lb

## 2015-02-22 DIAGNOSIS — K51919 Ulcerative colitis, unspecified with unspecified complications: Secondary | ICD-10-CM

## 2015-02-22 MED ORDER — NA SULFATE-K SULFATE-MG SULF 17.5-3.13-1.6 GM/177ML PO SOLN: ORAL | Status: DC

## 2015-02-22 NOTE — Progress Notes (Signed)
No egg or soy allergy  No anesthesia or intubation problems per pt  No diet medications taken  Registered in EMMI   

## 2015-03-04 ENCOUNTER — Encounter: Payer: Self-pay | Admitting: Gastroenterology

## 2015-03-07 ENCOUNTER — Ambulatory Visit (AMBULATORY_SURGERY_CENTER): Payer: Managed Care, Other (non HMO) | Admitting: Gastroenterology

## 2015-03-07 ENCOUNTER — Encounter: Payer: Self-pay | Admitting: Gastroenterology

## 2015-03-07 VITALS — BP 104/70 | HR 70 | Temp 96.4°F | Resp 19 | Ht 73.0 in | Wt 211.0 lb

## 2015-03-07 DIAGNOSIS — K51919 Ulcerative colitis, unspecified with unspecified complications: Secondary | ICD-10-CM

## 2015-03-07 DIAGNOSIS — K6289 Other specified diseases of anus and rectum: Secondary | ICD-10-CM

## 2015-03-07 DIAGNOSIS — D120 Benign neoplasm of cecum: Secondary | ICD-10-CM | POA: Diagnosis not present

## 2015-03-07 DIAGNOSIS — K529 Noninfective gastroenteritis and colitis, unspecified: Secondary | ICD-10-CM | POA: Diagnosis not present

## 2015-03-07 DIAGNOSIS — Z800 Family history of malignant neoplasm of digestive organs: Secondary | ICD-10-CM

## 2015-03-07 MED ORDER — SODIUM CHLORIDE 0.9 % IV SOLN: 500.0000 mL | INTRAVENOUS | Status: DC

## 2015-03-07 NOTE — Patient Instructions (Signed)
YOU HAD AN ENDOSCOPIC PROCEDURE TODAY AT THE [facility]:   Refer to the procedure report that was given to you for any specific questions about what was found during the examination.  If the procedure report does not answer your questions, please call your gastroenterologist to clarify.  If you requested that your care partner not be given the details of your procedure findings, then the procedure report has been included in a sealed envelope for you to review at your convenience later.  YOU SHOULD EXPECT: Some feelings of bloating in the abdomen. Passage of more gas than usual.  Walking can help get rid of the air that was put into your GI tract during the procedure and reduce the bloating. If you had a lower endoscopy (such as a colonoscopy or flexible sigmoidoscopy) you may notice spotting of blood in your stool or on the toilet paper. If you underwent a bowel prep for your procedure, you may not have a normal bowel movement for a few days.  Please Note:  You might notice some irritation and congestion in your nose or some drainage.  This is from the oxygen used during your procedure.  There is no need for concern and it should clear up in a day or so.  SYMPTOMS TO REPORT IMMEDIATELY:   Following lower endoscopy (colonoscopy or flexible sigmoidoscopy):  Excessive amounts of blood in the stool  Significant tenderness or worsening of abdominal pains  Swelling of the abdomen that is new, acute  Fever of 100F or higher   For urgent or emergent issues, a gastroenterologist can be reached at any hour by calling [phone].   DIET: Your first meal following the procedure should be a small meal and then it is ok to progress to your normal diet. Heavy or fried foods are harder to digest and may make you feel nauseous or bloated.  Likewise, meals heavy in dairy and vegetables can increase bloating.  Drink plenty of fluids but you should avoid alcoholic beverages for 24  hours.  ACTIVITY:  You should plan to take it easy for the rest of today and you should NOT DRIVE or use heavy machinery until tomorrow (because of the sedation medicines used during the test).    FOLLOW UP: Our staff will call the number listed on your records the next business day following your procedure to check on you and address any questions or concerns that you may have regarding the information given to you following your procedure. If we do not reach you, we will leave a message.  However, if you are feeling well and you are not experiencing any problems, there is no need to return our call.  We will assume that you have returned to your regular daily activities without incident.  If any biopsies were taken you will be contacted by phone or by letter within the next 1-3 weeks.  Please call us at [phone] if you have not heard about the biopsies in 3 weeks.    SIGNATURES/CONFIDENTIALITY: You and/or your care partner have signed paperwork which will be entered into your electronic medical record.  These signatures attest to the fact that that the information above on your After Visit Summary has been reviewed and is understood.  Full responsibility of the confidentiality of this discharge information lies with you and/or your care-partner. 

## 2015-03-07 NOTE — Progress Notes (Signed)
A/ox3, pleased with MAC, report to RN 

## 2015-03-07 NOTE — Op Note (Signed)
[place]  [address] [place], [id number]   COLONOSCOPY PROCEDURE REPORT  PATIENT: John White, Gelpi  MR#: [id number] BIRTHDATE: [birth year] , 54  yrs. old GENDER: male ENDOSCOPIST: Ladene Artist, MD, [facility] PROCEDURE DATE:  03/07/2015 PROCEDURE:   Colonoscopy, surveillance , Colonoscopy with snare polypectomy, and Colonoscopy with biopsy First Screening Colonoscopy - Avg.  risk and is 50 yrs.  old or older - No.  Prior Negative Screening - Now for repeat screening. N/A  History of Adenoma - Now for follow-up colonoscopy & has been > or = to 3 yrs.  N/A ASA CLASS:   Class II INDICATIONS:Screening for colonic neoplasia, Inflammatory Bowel Disease ( = 8 years pancolitis or = 15 years left sided colitis), and FH Colon or Rectal Adenocarcinoma. MEDICATIONS: Monitored anesthesia care and Propofol 250 mg IV DESCRIPTION OF PROCEDURE:   After the risks benefits and alternatives of the procedure were thoroughly explained, informed consent was obtained.  The digital rectal exam revealed no abnormalities of the rectum.   The LB [id number]  endoscope was introduced through the anus and advanced to the cecum, which was identified by both the appendix and ileocecal valve. No adverse events experienced.   The quality of the prep was excellent. (Suprep was used)  The instrument was then slowly withdrawn as the colon was fully examined.  COLON FINDINGS: A sessile polyp measuring 7 mm in size was found at the cecum.  A polypectomy was performed using snare cautery.  The resection was complete, the polyp tissue was completely retrieved and sent to histology.   Abnormal mucosa was found in the rectum. The mucosa was edematous, erythematous and had granularity and loss of vascularity.  Multiple biopsies of the area were performed. The examination was otherwise normal.   The colonic mucosa appeared normal at the splenic flexure, in the descending colon, sigmoid colon,  transverse colon, at the ileocecal valve, in the ascending colon, and at the hepatic flexure.  Multiple random biopsies were performed.  Retroflexed views revealed no abnormalities. The time to cecum = 3.4 Withdrawal time = 11.5   The scope was withdrawn and the procedure completed. COMPLICATIONS: There were no immediate complications.  ENDOSCOPIC IMPRESSION: 1.   Sessile polyp at the cecum; polypectomy performed using snare cautery 2.   Abnormal mucosa in the rectum; multiple biopsies performed 3.   The colonoscopy was otherwise normal: multiple random biopsies performed  RECOMMENDATIONS: 1.  Await pathology results 2.  Repeat Colonoscopy in 2-3 years pending pathology review  eSigned:  Ladene Artist, MD, [facility] 03/07/2015 10:22 AM   cc: Carol Ada, MD

## 2015-03-07 NOTE — Progress Notes (Signed)
Called to room to assist during endoscopic procedure.  Patient ID and intended procedure confirmed with present staff. Received instructions for my participation in the procedure from the performing physician.  

## 2015-03-08 ENCOUNTER — Telehealth: Payer: Self-pay | Admitting: *Deleted

## 2015-03-08 NOTE — Telephone Encounter (Signed)
Message left

## 2015-03-17 ENCOUNTER — Encounter: Payer: Self-pay | Admitting: Gastroenterology

## 2015-06-01 ENCOUNTER — Other Ambulatory Visit: Payer: Self-pay | Admitting: Family Medicine

## 2015-06-01 ENCOUNTER — Ambulatory Visit
Admission: RE | Admit: 2015-06-01 | Discharge: 2015-06-01 | Disposition: A | Discharge status: Home / Self Care | Payer: Managed Care, Other (non HMO) | Source: Ambulatory Visit | Attending: Family Medicine | Admitting: Family Medicine

## 2015-06-01 DIAGNOSIS — M7989 Other specified soft tissue disorders: Secondary | ICD-10-CM

## 2015-06-02 ENCOUNTER — Other Ambulatory Visit: Payer: Managed Care, Other (non HMO)

## 2015-07-06 ENCOUNTER — Other Ambulatory Visit: Payer: Self-pay | Admitting: Gastroenterology

## 2015-11-04 ENCOUNTER — Telehealth: Payer: Self-pay

## 2015-11-04 NOTE — Telephone Encounter (Signed)
Left message on machine to call back  

## 2015-11-04 NOTE — Telephone Encounter (Signed)
-----   Message from Kellie Moor, RN sent at 11/03/2014 10:06 AM EST ----- Needs [place] in 1 year

## 2015-11-08 NOTE — Telephone Encounter (Signed)
Left message on machine to call back letter mailed. 

## 2018-02-10 ENCOUNTER — Encounter: Payer: Self-pay | Admitting: Gastroenterology

## 2018-11-12 HISTORY — PX: COLONOSCOPY: SHX174

## 2018-12-03 ENCOUNTER — Encounter: Payer: Self-pay | Admitting: Gastroenterology

## 2018-12-10 ENCOUNTER — Encounter: Payer: Self-pay | Admitting: Gastroenterology

## 2018-12-10 ENCOUNTER — Ambulatory Visit (AMBULATORY_SURGERY_CENTER): Payer: Self-pay

## 2018-12-10 VITALS — Ht 73.0 in | Wt 220.0 lb

## 2018-12-10 DIAGNOSIS — Z860100 Personal history of colon polyps, unspecified: Secondary | ICD-10-CM

## 2018-12-10 DIAGNOSIS — K51919 Ulcerative colitis, unspecified with unspecified complications: Secondary | ICD-10-CM

## 2018-12-10 DIAGNOSIS — Z800 Family history of malignant neoplasm of digestive organs: Secondary | ICD-10-CM

## 2018-12-10 MED ORDER — NA SULFATE-K SULFATE-MG SULF 17.5-3.13-1.6 GM/177ML PO SOLN: 1.0000 | Freq: Once | ORAL | 0 refills | Status: AC

## 2018-12-10 NOTE — Progress Notes (Signed)
No egg or soy allergy known to patient  No issues with past sedation with any surgeries  or procedures, no intubation problems  No diet pills per patient No home 02 use per patient  No blood thinners per patient  Pt denies issues with constipation  No A fib or A flutter  EMMI video sent to pt's e mail  Pt. declined 

## 2018-12-22 ENCOUNTER — Ambulatory Visit (AMBULATORY_SURGERY_CENTER): Payer: Managed Care, Other (non HMO) | Admitting: Gastroenterology

## 2018-12-22 ENCOUNTER — Encounter: Payer: Self-pay | Admitting: Gastroenterology

## 2018-12-22 VITALS — BP 125/78 | HR 73 | Temp 97.7°F | Resp 10 | Ht 73.0 in | Wt 220.0 lb

## 2018-12-22 DIAGNOSIS — K529 Noninfective gastroenteritis and colitis, unspecified: Secondary | ICD-10-CM | POA: Diagnosis not present

## 2018-12-22 DIAGNOSIS — K5100 Ulcerative (chronic) pancolitis without complications: Secondary | ICD-10-CM | POA: Diagnosis not present

## 2018-12-22 DIAGNOSIS — Z86010 Personal history of colonic polyps: Secondary | ICD-10-CM | POA: Diagnosis present

## 2018-12-22 DIAGNOSIS — Z860101 Personal history of adenomatous and serrated colon polyps: Secondary | ICD-10-CM

## 2018-12-22 MED ORDER — SODIUM CHLORIDE 0.9 % IV SOLN: 500.0000 mL | Freq: Once | INTRAVENOUS | Status: DC

## 2018-12-22 MED ORDER — MESALAMINE 1.2 G PO TBEC: 2.4000 g | DELAYED_RELEASE_TABLET | Freq: Every day | ORAL | 11 refills | Status: DC

## 2018-12-22 NOTE — Progress Notes (Signed)
A and O x3. Report to RN. Tolerated MAC anesthesia well. 

## 2018-12-22 NOTE — Patient Instructions (Signed)
Return to GI office in 1 year.  YOU HAD AN ENDOSCOPIC PROCEDURE TODAY AT [facility]:   Refer to the procedure report that was given to you for any specific questions about what was found during the examination.  If the procedure report does not answer your questions, please call your gastroenterologist to clarify.  If you requested that your care partner not be given the details of your procedure findings, then the procedure report has been included in a sealed envelope for you to review at your convenience later.  YOU SHOULD EXPECT: Some feelings of bloating in the abdomen. Passage of more gas than usual.  Walking can help get rid of the air that was put into your GI tract during the procedure and reduce the bloating. If you had a lower endoscopy (such as a colonoscopy or flexible sigmoidoscopy) you may notice spotting of blood in your stool or on the toilet paper. If you underwent a bowel prep for your procedure, you may not have a normal bowel movement for a few days.  Please Note:  You might notice some irritation and congestion in your nose or some drainage.  This is from the oxygen used during your procedure.  There is no need for concern and it should clear up in a day or so.  SYMPTOMS TO REPORT IMMEDIATELY:   Following lower endoscopy (colonoscopy or flexible sigmoidoscopy):  Excessive amounts of blood in the stool  Significant tenderness or worsening of abdominal pains  Swelling of the abdomen that is new, acute  Fever of 100F or higher  For urgent or emergent issues, a gastroenterologist can be reached at any hour by calling [phone].   DIET:  We do recommend a small meal at first, but then you may proceed to your regular diet.  Drink plenty of fluids but you should avoid alcoholic beverages for 24 hours.  ACTIVITY:  You should plan to take it easy for the rest of today and you should NOT DRIVE or use heavy machinery until tomorrow (because of the sedation  medicines used during the test).    FOLLOW UP: Our staff will call the number listed on your records the next business day following your procedure to check on you and address any questions or concerns that you may have regarding the information given to you following your procedure. If we do not reach you, we will leave a message.  However, if you are feeling well and you are not experiencing any problems, there is no need to return our call.  We will assume that you have returned to your regular daily activities without incident.  If any biopsies were taken you will be contacted by phone or by letter within the next 1-3 weeks.  Please call us at [phone] if you have not heard about the biopsies in 3 weeks.    SIGNATURES/CONFIDENTIALITY: You and/or your care partner have signed paperwork which will be entered into your electronic medical record.  These signatures attest to the fact that that the information above on your After Visit Summary has been reviewed and is understood.  Full responsibility of the confidentiality of this discharge information lies with you and/or your care-partner.

## 2018-12-22 NOTE — Progress Notes (Signed)
Called to room to assist during endoscopic procedure.  Patient ID and intended procedure confirmed with present staff. Received instructions for my participation in the procedure from the performing physician.  

## 2018-12-22 NOTE — Op Note (Signed)
[facility] Patient Name: John White Procedure Date: 12/22/2018 3:15 PM MRN: [id number] Endoscopist: Ladene Artist , MD Age: 58 Referring MD:  Date of Birth: 10-13-1961 Gender: Male Account #: [id number] Procedure:                Colonoscopy Indications:              Surveillance: Personal history of adenomatous                            polyps on last colonoscopy 3 years ago. Personal                            history of long standing ulcerative colitis. Medicines:                Monitored Anesthesia Care Procedure:                Pre-Anesthesia Assessment:                           - Prior to the procedure, a History and Physical                            was performed, and patient medications and                            allergies were reviewed. The patient's tolerance of                            previous anesthesia was also reviewed. The risks                            and benefits of the procedure and the sedation                            options and risks were discussed with the patient.                            All questions were answered, and informed consent                            was obtained. Prior Anticoagulants: The patient has                            taken no previous anticoagulant or antiplatelet                            agents. ASA Grade Assessment: II - A patient with                            mild systemic disease. After reviewing the risks                            and benefits, the patient was deemed in  satisfactory condition to undergo the procedure.                           After obtaining informed consent, the colonoscope                            was passed under direct vision. Throughout the                            procedure, the patient's blood pressure, pulse, and                            oxygen saturations were monitored continuously. The                            Colonoscope was  introduced through the anus and                            advanced to the the cecum, identified by                            appendiceal orifice and ileocecal valve. The                            ileocecal valve, appendiceal orifice, and rectum                            were photographed. The quality of the bowel                            preparation was good. The colonoscopy was performed                            without difficulty. The patient tolerated the                            procedure well. Scope In: 3:29:03 PM Scope Out: 3:45:34 PM Scope Withdrawal Time: 0 hours 12 minutes 6 seconds  Total Procedure Duration: 0 hours 16 minutes 31 seconds  Findings:                 The perianal and digital rectal examinations were                            normal.                           Patchy mucosal changes characterized by erythema                            were found in the rectum. Biopsies were taken with                            a cold forceps for histology.  Internal hemorrhoids were found during                            retroflexion. The hemorrhoids were small and Grade                            I (internal hemorrhoids that do not prolapse).                           The exam was otherwise without abnormality on                            direct and retroflexion views. Random right colon                            and left colon biopsies obtained. Complications:            No immediate complications. Estimated blood loss:                            None. Estimated Blood Loss:     Estimated blood loss: none. Impression:               - Patchy mucosal changes were found in the rectum.                            Biopsied.                           - Internal hemorrhoids.                           - The examination was otherwise normal on direct                            and retroflexion views. Recommendation:           - Repeat colonoscopy in  3 years for surveillance.                           - Patient has a contact number available for                            emergencies. The signs and symptoms of potential                            delayed complications were discussed with the                            patient. Return to normal activities tomorrow.                            Written discharge instructions were provided to the                            patient.                           -  Resume previous diet.                           - Continue present medications.                           - Await pathology results.                           - Lialda 1.2 gm at 2 tabs PO daily, 1 year of                            refills.                           - Return to GI office in 1 year. Ladene Artist, MD 12/22/2018 3:50:45 PM This report has been signed electronically.

## 2018-12-22 NOTE — Progress Notes (Signed)
Pt's states no medical or surgical changes since previsit or office visit. 

## 2018-12-23 ENCOUNTER — Telehealth: Payer: Self-pay

## 2018-12-23 NOTE — Telephone Encounter (Signed)
No answer, left message to call back later today, B.Aza Dantes RN. 

## 2018-12-23 NOTE — Telephone Encounter (Signed)
Second post procedure follow up, no answer 

## 2018-12-24 ENCOUNTER — Encounter: Payer: Managed Care, Other (non HMO) | Admitting: Gastroenterology

## 2019-01-07 ENCOUNTER — Encounter: Payer: Self-pay | Admitting: Gastroenterology

## 2021-05-08 ENCOUNTER — Telehealth: Payer: Self-pay | Admitting: Gastroenterology

## 2021-05-08 NOTE — Telephone Encounter (Signed)
Inbound call from patient wife. States patient is having a colitis flare up and wonder if anything could be prescribed to help him. She does not remember the name of the medication. Pharmacy is [facility] in [place], [place]. He have an appointment 7/12 with Anderson Malta.

## 2021-05-08 NOTE — Telephone Encounter (Signed)
Patient not seen since 2020.  He will need OV before any meds.  He will come in and see Carl Best, RNP on 05/11/21 1:30

## 2021-05-10 ENCOUNTER — Other Ambulatory Visit: Payer: Self-pay

## 2021-05-11 ENCOUNTER — Ambulatory Visit (INDEPENDENT_AMBULATORY_CARE_PROVIDER_SITE_OTHER): Payer: BC Managed Care – PPO | Admitting: Nurse Practitioner

## 2021-05-11 ENCOUNTER — Other Ambulatory Visit (INDEPENDENT_AMBULATORY_CARE_PROVIDER_SITE_OTHER): Payer: BC Managed Care – PPO

## 2021-05-11 VITALS — BP 108/74 | HR 94 | Ht 73.0 in | Wt 212.5 lb

## 2021-05-11 DIAGNOSIS — Z86010 Personal history of colonic polyps: Secondary | ICD-10-CM | POA: Diagnosis not present

## 2021-05-11 DIAGNOSIS — K5100 Ulcerative (chronic) pancolitis without complications: Secondary | ICD-10-CM

## 2021-05-11 DIAGNOSIS — K51911 Ulcerative colitis, unspecified with rectal bleeding: Secondary | ICD-10-CM

## 2021-05-11 DIAGNOSIS — K5190 Ulcerative colitis, unspecified, without complications: Secondary | ICD-10-CM | POA: Insufficient documentation

## 2021-05-11 DIAGNOSIS — K51011 Ulcerative (chronic) pancolitis with rectal bleeding: Secondary | ICD-10-CM

## 2021-05-11 LAB — HEPATIC FUNCTION PANEL
ALT: 18 U/L (ref 0–53)
AST: 17 U/L (ref 0–37)
Albumin: 4.3 g/dL (ref 3.5–5.2)
Alkaline Phosphatase: 57 U/L (ref 39–117)
Bilirubin, Direct: 0.1 mg/dL (ref 0.0–0.3)
Total Bilirubin: 0.6 mg/dL (ref 0.2–1.2)
Total Protein: 7.3 g/dL (ref 6.0–8.3)

## 2021-05-11 LAB — C-REACTIVE PROTEIN: CRP: 1.3 mg/dL (ref 0.5–20.0)

## 2021-05-11 MED ORDER — MESALAMINE 1.2 G PO TBEC: 2.4000 g | DELAYED_RELEASE_TABLET | Freq: Two times a day (BID) | ORAL | 2 refills | Status: DC

## 2021-05-11 MED ORDER — MESALAMINE 1000 MG RE SUPP: 1000.0000 mg | Freq: Every day | RECTAL | 0 refills | Status: DC

## 2021-05-11 NOTE — Progress Notes (Signed)
05/11/2021 Erling Arrazola [id number] 1961/08/18   Chief Complaint: Possible colitis flare   History of Present Illness: John White is a 60 year old male with a past medical history of LUE DVT and portal vein thrombosis 2010, ulcerative colitis initially diagnosed at the age of 55, rectal carcinoid 1998 and hyperplastic colon polyp 2006.  He is followed by Dr. Fuller Plan. He presents to our office today concerned he is having colitis symptoms.  He developed nausea with a few episodes of vomiting and diarrhea on 04/13/2021 which lasted for 1 day.  On Saturday 6/4, he found a tick under his right arm area.  Tuesday 6/21 he had nausea and passed 4-5 muddy episodes of diarrhea with a little bit of blood X 3 days.  He also noticed passing mucus per the rectum with small blood clots tenesmus. He subsequently developed a bull's-eye ring around the tick bite site and he took a dose of Doxycycline from a supply his wife previously had at home he developed palpitations.  He called his PCP he was advised to go to the ED for further evaluation regarding his tick bite site and palpitations.  He presented to [facility] ED on 05/05/2021.  His EKG and troponin levels were unrevealing.  He was prescribed Doxycycline 100 mg twice daily for 21 days.  A Lyme disease DNA test result was negative therefore he stopped taking Doxycycline on Monday 6/27. His stools are softly formed passed few days with less bloody mucous.  He continued to have heart racing and palpitations.  He was seen by cardiologist Dr. Posey Pronto in [place] who ordered heart monitor study and Metoprolol.  Has not yet started taking the Metoprolol.  History of MVP for which he takes ASA 81 mg daily many years.  He avoids all other NSAIDs.  His most recent colonoscopy was 12/22/2018 by Dr. Fuller Plan which identified patchy mucosal changes in the rectum and internal hemorrhoids.  Biopsy showed minimally active chronic colitis to the right colon consistent with IBD.   A repeat colonoscopy in 3 years was recommended.  He stopped taking Lialda about 2 years ago because he did not think he needed it.  Father with history of colon cancer diagnosed at the age of 22.  Laboratory studies 05/05/2021 showed a WBC count 3.83.  Hemoglobin 14.9.  Hematocrit 43.6.  MCV 88.6.  Platelet 191.  BUN 14.  Creatinine 0.97.  Sodium 140.  Potassium 3.6.  Colonoscopy 12/22/2018: Patchy mucosal changes were found in the rectum. Biopsied. - Internal hemorrhoids. - The examination was otherwise normal on direct and retroflexion views. - Repeat colonoscopy in 3 years 1. Surgical [P], random sites right colon - MINIMALLY ACTIVE CHRONIC COLITIS, CONSISTENT WITH INFLAMMATORY BOWEL DISEASE. - THERE IS NO EVIDENCE OF DYSPLASIA OR MALIGNANCY. 2. Surgical [P], random sites - BENIGN COLONIC MUCOSA. - NO SIGNIFICANT INFLAMMATION OR OTHER ABNORMALITIES IDENTIFIED.  Past Medical History:  Diagnosis Date   Clotting disorder ([place])    hx DVT- arms   Kidney stones    Mitral valve prolapse    Staph infection    finger- treated by dermatologist, no treatment now   Past Surgical History:  Procedure Laterality Date   APPENDECTOMY     COLONOSCOPY     LAPAROSCOPIC APPENDECTOMY  2008   LITHOTRIPSY  2009   kidney stones   LUMBAR LAMINECTOMY  2003, 2011   POLYPECTOMY     STOMACH SURGERY     when 59 months old for tumor  Current Outpatient Medications on File Prior to Visit  Medication Sig Dispense Refill   aspirin EC 81 MG tablet Take 81 mg by mouth daily.     No current facility-administered medications on file prior to visit.   No Known Allergies   Current Medications, Allergies, Past Medical History, Past Surgical History, Family History and Social History were reviewed in [employer] record.  Review of Systems:   Constitutional: Negative for fever, sweats, chills or weight loss.  Respiratory: Negative for shortness of breath.   Cardiovascular: See  HPI. Gastrointestinal: See HPI.  Musculoskeletal: Negative for back pain or muscle aches.  Neurological: Negative for dizziness, headaches or paresthesias.   Physical Exam: BP 108/74 (BP Location: Right Arm, Patient Position: Sitting, Cuff Size: Normal)   Pulse 94   Ht 6\' 1"  (1.854 m)   Wt 212 lb 8 oz (96.4 kg)   SpO2 96%   BMI 28.04 kg/m   General: 60 year old male in no acute distress. Head: Normocephalic and atraumatic. Eyes: No scleral icterus. Conjunctiva pink . Ears: Normal auditory acuity. Mouth: Dentition intact. No ulcers or lesions.  Lungs: Clear throughout to auscultation. Heart: Regular rate and rhythm, no murmur. Abdomen: Soft, nontender.  Mild central lower abdominal tenderness without rebound or guarding.  No masses or hepatomegaly. Normal bowel sounds x 4 quadrants.  Rectal: Deferred. Musculoskeletal: Symmetrical with no gross deformities. Extremities: No edema. Neurological: Alert oriented x 4. No focal deficits.  Psychological: Alert and cooperative. Normal mood and affect  Assessment and Recommendations:  19.  60 year old male with a history of rectal carcinoid 1998. Diagnosed with ulcerative colitis at the age of 89 presents today with colitis symptoms, intermittent bloody diarrhea, tenesmus and lower abdominal discomfort, no severe pain.  Normal CBC and BMP done in the ED on 05/05/2021. -Hepatic panel, CRP and fecal calprotectin level -Mesalamine 1.2 g 2 tabs p.o. twice daily -Canasa suppository 1 gm PR nightly for 2 weeks then every other night for 2 weeks -Patient to call our office if his symptoms worsen -Follow-up in the office with Dr. Fuller Plan in 8 weeks, to consider repeat colonoscopy if bloody diarrhea and tenesmus symptoms persist to assess for IBD and rule out recurrence of rectal carcinoid  -Patient to call our office if symptoms worsen  2.  Recent tick bite with bull's-eye rash, Lyme DNA test negative -Recommend repeat Lyme testing in 4 to 6  weeks -Further follow-up and management per his PCP  3.  MVP. Palpitations, resolving.  Patient has not started Metoprolol. -Continue follow-up with your cardiologist  4. History of UE DVT and portal vein thrombosis on ASA

## 2021-05-11 NOTE — Patient Instructions (Addendum)
If you are age 59 or younger, your body mass index should be between 19-25. Your Body mass index is 28.04 kg/m. If this is out of the aformentioned range listed, please consider follow up with your Primary Care Provider.   LABS:  Lab work has been ordered for you today. Our lab is located in the basement. Press "B" on the elevator. The lab is located at the first door on the left as you exit the elevator.  MEDICATION: We have sent the following medication to your pharmacy for you to pick up at your convenience: Mesalamine 1.2 gm tablet. Take 2 tablets twice a day. Mesalamine rectal suppository. Use one at night for 2 weeks then stop.  Please follow up with Dr. Fuller Plan in 8 weeks.  Please call our office if your symptoms worsen. It was great seeing you today! Thank you for entrusting me with your care and choosing [facility].  Noralyn Pick, CRNP

## 2021-05-12 ENCOUNTER — Telehealth: Payer: Self-pay

## 2021-05-12 NOTE — Telephone Encounter (Signed)
-----   Message from Noralyn Pick, NP sent at 05/11/2021  6:59 PM EDT ----- Lenna Sciara, pls inform the patient his labs were normal thx

## 2021-05-12 NOTE — Telephone Encounter (Signed)
Notified patient of normal labs.

## 2021-05-15 NOTE — Progress Notes (Signed)
Reviewed and agree with management plan.  Kaulana Brindle T. Arriah Wadle, MD FACG [phone]  

## 2021-05-23 ENCOUNTER — Ambulatory Visit: Payer: Managed Care, Other (non HMO) | Admitting: Physician Assistant

## 2022-01-17 ENCOUNTER — Encounter: Payer: Self-pay | Admitting: Gastroenterology

## 2022-08-24 ENCOUNTER — Encounter: Payer: Self-pay | Admitting: Gastroenterology

## 2022-09-05 ENCOUNTER — Ambulatory Visit (AMBULATORY_SURGERY_CENTER): Payer: BC Managed Care – PPO

## 2022-09-05 VITALS — Ht 73.0 in | Wt 222.0 lb

## 2022-09-05 DIAGNOSIS — K51011 Ulcerative (chronic) pancolitis with rectal bleeding: Secondary | ICD-10-CM

## 2022-09-05 DIAGNOSIS — K51911 Ulcerative colitis, unspecified with rectal bleeding: Secondary | ICD-10-CM

## 2022-09-05 DIAGNOSIS — Z86010 Personal history of colonic polyps: Secondary | ICD-10-CM

## 2022-09-05 DIAGNOSIS — Z800 Family history of malignant neoplasm of digestive organs: Secondary | ICD-10-CM

## 2022-09-05 MED ORDER — NA SULFATE-K SULFATE-MG SULF 17.5-3.13-1.6 GM/177ML PO SOLN: 1.0000 | Freq: Once | ORAL | 0 refills | Status: AC

## 2022-09-05 NOTE — Progress Notes (Signed)
Pre visit completed via phone call; Patient verified name, DOB, and address;  No egg or soy allergy known to patient  No issues known to pt with past sedation with any surgeries or procedures Patient denies ever being told they had issues or difficulty with intubation  No FH of Malignant Hyperthermia Pt is not on diet pills Pt is not on home 02  Pt is not on blood thinners  Pt denies issues with constipation  No A fib or A flutter Have any cardiac testing pending--NO Pt instructed to use Singlecare.com or GoodRx for a price reduction on prep   Patient's chart reviewed by Osvaldo Angst CNRA prior to previsit and patient appropriate for the [place].  Previsit completed and red dot placed by patient's name on their procedure day (on provider's schedule).

## 2022-10-08 ENCOUNTER — Encounter: Payer: BC Managed Care – PPO | Admitting: Gastroenterology

## 2022-10-15 ENCOUNTER — Encounter: Payer: Self-pay | Admitting: Certified Registered Nurse Anesthetist

## 2022-10-17 ENCOUNTER — Telehealth: Payer: Self-pay | Admitting: Gastroenterology

## 2022-10-17 ENCOUNTER — Encounter: Payer: Self-pay | Admitting: Gastroenterology

## 2022-10-17 NOTE — Telephone Encounter (Signed)
All questions answered

## 2022-10-17 NOTE — Telephone Encounter (Signed)
Patient would like to speak to nurse regarding prep instructions before procedure on 12/11

## 2022-10-21 ENCOUNTER — Encounter: Payer: Self-pay | Admitting: Certified Registered Nurse Anesthetist

## 2022-10-22 ENCOUNTER — Encounter: Payer: BC Managed Care – PPO | Admitting: Gastroenterology

## 2022-10-22 ENCOUNTER — Telehealth: Payer: Self-pay | Admitting: Gastroenterology

## 2022-10-22 NOTE — Telephone Encounter (Signed)
Good Morning Dr. Fuller Plan,    I called patient to see if he was coming for his procedure,  he  stated that he call over the weekend with after hour doctor he stated he had a fever and not feeling well.   He cancelled appointment at that time.   Marland Kitchen

## 2023-01-17 ENCOUNTER — Encounter: Payer: Self-pay | Admitting: Gastroenterology

## 2023-02-12 DIAGNOSIS — S91331A Puncture wound without foreign body, right foot, initial encounter: Secondary | ICD-10-CM | POA: Diagnosis not present

## 2023-02-12 DIAGNOSIS — X58XXXA Exposure to other specified factors, initial encounter: Secondary | ICD-10-CM | POA: Diagnosis not present

## 2023-03-07 ENCOUNTER — Ambulatory Visit (AMBULATORY_SURGERY_CENTER): Payer: BC Managed Care – PPO

## 2023-03-07 VITALS — Ht 73.0 in | Wt 225.0 lb

## 2023-03-07 DIAGNOSIS — Z800 Family history of malignant neoplasm of digestive organs: Secondary | ICD-10-CM

## 2023-03-07 DIAGNOSIS — K51911 Ulcerative colitis, unspecified with rectal bleeding: Secondary | ICD-10-CM

## 2023-03-07 DIAGNOSIS — K51011 Ulcerative (chronic) pancolitis with rectal bleeding: Secondary | ICD-10-CM

## 2023-03-07 DIAGNOSIS — Z86010 Personal history of colonic polyps: Secondary | ICD-10-CM

## 2023-03-07 MED ORDER — NA SULFATE-K SULFATE-MG SULF 17.5-3.13-1.6 GM/177ML PO SOLN: 1.0000 | Freq: Once | ORAL | 0 refills | Status: AC

## 2023-03-07 NOTE — Progress Notes (Signed)
No egg or soy allergy known to patient  No issues known to pt with past sedation with any surgeries or procedures Patient denies ever being told they had issues or difficulty with intubation  No FH of Malignant Hyperthermia Pt is not on diet pills Pt is not on  home 02  Pt is not on blood thinners  Pt denies issues with constipation  No A fib or A flutter Have any cardiac testing pending--no Pt instructed to use Singlecare.com or GoodRx for a price reduction on prep  Patient's chart reviewed by Cathlyn Parsons CNRA prior to previsit and patient appropriate for the [facility].  Previsit completed and red dot placed by patient's name on their procedure day (on provider's schedule).    Can ambulate without any assistance

## 2023-03-21 ENCOUNTER — Encounter: Payer: BC Managed Care – PPO | Admitting: Gastroenterology

## 2023-04-18 ENCOUNTER — Encounter: Payer: Self-pay | Admitting: Gastroenterology

## 2023-04-29 ENCOUNTER — Telehealth: Payer: Self-pay

## 2023-04-29 ENCOUNTER — Telehealth: Payer: Self-pay | Admitting: Gastroenterology

## 2023-04-29 NOTE — Telephone Encounter (Signed)
Spoke with patient all questions answered. Pt r/s. New PV apt scheduled.

## 2023-04-29 NOTE — Telephone Encounter (Signed)
Patient called stated he was on a cruise and he had Covid 19 tested negative today and would like to know if he can still proceed with the scheduled procedure on 05/03/23.Marland Kitchen

## 2023-05-03 ENCOUNTER — Encounter: Payer: BC Managed Care – PPO | Admitting: Gastroenterology

## 2023-06-27 ENCOUNTER — Encounter: Payer: BC Managed Care – PPO | Admitting: Gastroenterology

## 2023-08-19 ENCOUNTER — Ambulatory Visit (AMBULATORY_SURGERY_CENTER): Payer: BC Managed Care – PPO

## 2023-08-19 ENCOUNTER — Encounter: Payer: Self-pay | Admitting: Gastroenterology

## 2023-08-19 VITALS — Ht 73.0 in | Wt 230.0 lb

## 2023-08-19 DIAGNOSIS — Z860100 Personal history of colon polyps, unspecified: Secondary | ICD-10-CM

## 2023-08-19 MED ORDER — NA SULFATE-K SULFATE-MG SULF 17.5-3.13-1.6 GM/177ML PO SOLN: 1.0000 | Freq: Once | ORAL | 0 refills | Status: AC

## 2023-08-19 NOTE — Progress Notes (Signed)

## 2023-08-29 ENCOUNTER — Encounter: Payer: Self-pay | Admitting: Gastroenterology

## 2023-08-29 ENCOUNTER — Ambulatory Visit: Payer: BC Managed Care – PPO | Admitting: Gastroenterology

## 2023-08-29 VITALS — BP 109/72 | HR 76 | Temp 98.1°F | Resp 14 | Ht 73.0 in | Wt 225.0 lb

## 2023-08-29 DIAGNOSIS — Z09 Encounter for follow-up examination after completed treatment for conditions other than malignant neoplasm: Secondary | ICD-10-CM | POA: Diagnosis not present

## 2023-08-29 DIAGNOSIS — Z860100 Personal history of colon polyps, unspecified: Secondary | ICD-10-CM | POA: Diagnosis not present

## 2023-08-29 DIAGNOSIS — K639 Disease of intestine, unspecified: Secondary | ICD-10-CM | POA: Diagnosis not present

## 2023-08-29 DIAGNOSIS — Z1211 Encounter for screening for malignant neoplasm of colon: Secondary | ICD-10-CM | POA: Diagnosis not present

## 2023-08-29 DIAGNOSIS — K529 Noninfective gastroenteritis and colitis, unspecified: Secondary | ICD-10-CM | POA: Diagnosis not present

## 2023-08-29 DIAGNOSIS — D125 Benign neoplasm of sigmoid colon: Secondary | ICD-10-CM

## 2023-08-29 DIAGNOSIS — K635 Polyp of colon: Secondary | ICD-10-CM | POA: Diagnosis not present

## 2023-08-29 DIAGNOSIS — K5100 Ulcerative (chronic) pancolitis without complications: Secondary | ICD-10-CM | POA: Diagnosis not present

## 2023-08-29 MED ORDER — MESALAMINE 1.2 G PO TBEC: DELAYED_RELEASE_TABLET | ORAL | 11 refills | Status: DC

## 2023-08-29 MED ORDER — SODIUM CHLORIDE 0.9 % IV SOLN: 500.0000 mL | INTRAVENOUS | Status: DC

## 2023-08-29 NOTE — Progress Notes (Signed)
Pt's states no medical or surgical changes since previsit or office visit. 

## 2023-08-29 NOTE — Progress Notes (Signed)
Called to room to assist during endoscopic procedure.  Patient ID and intended procedure confirmed with present staff. Received instructions for my participation in the procedure from the performing physician.  

## 2023-08-29 NOTE — Progress Notes (Signed)
Vss nad trans to pacu 

## 2023-08-29 NOTE — Progress Notes (Signed)
History & Physical  Primary Care Physician:  Merri Brunette, MD Primary Gastroenterologist: Claudette Head, MD  Impression / Plan:  History of ulcerative colitis, history of adenomatous colon polyps and a remote history of rectal carcinoid for surveillance colonoscopy.  CHIEF COMPLAINT: Ulcerative colitis, Personal history of colon polyps   HPI: John White is a 62 y.o. male with a history of ulcerative colitis, history of adenomatous colon polyps and a remote history of rectal carcinoid for surveillance  colonoscopy.    Past Medical History:  Diagnosis Date   Arthritis    bilateral hands/fingers   Clotting disorder ([facility])    hx DVT- arms   Kidney stones    Mitral valve prolapse    Staph infection    finger- treated by dermatologist, no treatment now    Past Surgical History:  Procedure Laterality Date   COLONOSCOPY  2020   MS-MAC-suprep(good)-int hems/minimally active chronic colitis   LAPAROSCOPIC APPENDECTOMY  2008   LITHOTRIPSY  2009   kidney stones   LUMBAR LAMINECTOMY  2003, 2011   POLYPECTOMY     STOMACH SURGERY     when 8 months old for tumor   TONSILLECTOMY  1969    Prior to Admission medications   Not on File    No current outpatient medications on file.   Current Facility-Administered Medications  Medication Dose Route Frequency Provider Last Rate Last Admin   0.9 %  sodium chloride infusion  500 mL Intravenous Continuous Meryl Dare, MD        Allergies as of 08/29/2023   (No Known Allergies)    Family History  Problem Relation Age of Onset   Colon polyps Father 69   Colon cancer Father 69   Stomach cancer Neg Hx    Esophageal cancer Neg Hx    Rectal cancer Neg Hx     Social History   Socioeconomic History   Marital status: Married    Spouse name: Not on file   Number of children: Not on file   Years of education: Not on file   Highest education level: Not on file  Occupational History   Not on file  Tobacco Use    Smoking status: Never   Smokeless tobacco: Never  Vaping Use   Vaping status: Never Used  Substance and Sexual Activity   Alcohol use: No   Drug use: No   Sexual activity: Yes  Other Topics Concern   Not on file  Social History Narrative   Not on file   Social Determinants of Health   Financial Resource Strain: Not on file  Food Insecurity: Not on file  Transportation Needs: Not on file  Physical Activity: Not on file  Stress: Not on file  Social Connections: Not on file  Intimate Partner Violence: Not on file    Review of Systems:  All systems reviewed were negative except where noted in HPI.   Physical Exam:  General:  Alert, well-developed, in NAD Head:  Normocephalic and atraumatic. Eyes:  Sclera clear, no icterus.   Conjunctiva pink. Ears:  Normal auditory acuity. Mouth:  No deformity or lesions.  Neck:  Supple; no masses. Lungs:  Clear throughout to auscultation.   No wheezes, crackles, or rhonchi.  Heart:  Regular rate and rhythm; no murmurs. Abdomen:  Soft, nondistended, nontender. No masses, hepatomegaly. No palpable masses.  Normal bowel sounds.    Rectal:  Deferred   Msk:  Symmetrical without gross deformities. Extremities:  Without edema. Neurologic:  Alert and  oriented x 4; grossly normal neurologically. Skin:  Intact without significant lesions or rashes. Psych:  Alert and cooperative. Normal mood and affect.   Venita Lick. Russella Dar  08/29/2023, 8:07 AM See Loretha Stapler, [facility], to contact our on call provider

## 2023-08-29 NOTE — Patient Instructions (Addendum)
-   Resume previous diet. - Continue present medications. - Await pathology results. -  Lialda 1.2 gm at 2 tabs PO daily indefinitely, 1 year of refills. - GI office appt 6 months with Dr. Doy Hutching - Patient given educational handouts related to procedure.   YOU HAD AN ENDOSCOPIC PROCEDURE TODAY AT THE [facility]:   Refer to the procedure report that was given to you for any specific questions about what was found during the examination.  If the procedure report does not answer your questions, please call your gastroenterologist to clarify.  If you requested that your care partner not be given the details of your procedure findings, then the procedure report has been included in a sealed envelope for you to review at your convenience later.  YOU SHOULD EXPECT: Some feelings of bloating in the abdomen. Passage of more gas than usual.  Walking can help get rid of the air that was put into your GI tract during the procedure and reduce the bloating. If you had a lower endoscopy (such as a colonoscopy or flexible sigmoidoscopy) you may notice spotting of blood in your stool or on the toilet paper. If you underwent a bowel prep for your procedure, you may not have a normal bowel movement for a few days.  Please Note:  You might notice some irritation and congestion in your nose or some drainage.  This is from the oxygen used during your procedure.  There is no need for concern and it should clear up in a day or so.  SYMPTOMS TO REPORT IMMEDIATELY:  Following lower endoscopy (colonoscopy or flexible sigmoidoscopy):  Excessive amounts of blood in the stool  Significant tenderness or worsening of abdominal pains  Swelling of the abdomen that is new, acute  Fever of 100F or higher  For urgent or emergent issues, a gastroenterologist can be reached at any hour by calling [phone]. Do not use MyChart messaging for urgent concerns.    DIET:  We do recommend a small meal at first, but  then you may proceed to your regular diet.  Drink plenty of fluids but you should avoid alcoholic beverages for 24 hours.  ACTIVITY:  You should plan to take it easy for the rest of today and you should NOT DRIVE or use heavy machinery until tomorrow (because of the sedation medicines used during the test).    FOLLOW UP: Our staff will call the number listed on your records the next business day following your procedure.  We will call around 7:15- 8:00 am to check on you and address any questions or concerns that you may have regarding the information given to you following your procedure. If we do not reach you, we will leave a message.     If any biopsies were taken you will be contacted by phone or by letter within the next 1-3 weeks.  Please call us at [phone] if you have not heard about the biopsies in 3 weeks.    SIGNATURES/CONFIDENTIALITY: You and/or your care partner have signed paperwork which will be entered into your electronic medical record.  These signatures attest to the fact that that the information above on your After Visit Summary has been reviewed and is understood.  Full responsibility of the confidentiality of this discharge information lies with you and/or your care-partner.

## 2023-08-29 NOTE — Op Note (Signed)
[facility] Patient Name: John White Procedure Date: 08/29/2023 7:55 AM MRN: [id number] Endoscopist: Meryl Dare , MD, [phone] Age: 62 Referring MD:  Date of Birth: 1961-06-13 Gender: Male Account #: [id number] Procedure:                Colonoscopy Indications:              High risk colon cancer surveillance: Ulcerative                            pancolitis of 8 (or more) years duration Medicines:                Monitored Anesthesia Care Procedure:                Pre-Anesthesia Assessment:                           - Prior to the procedure, a History and Physical                            was performed, and patient medications and                            allergies were reviewed. The patient's tolerance of                            previous anesthesia was also reviewed. The risks                            and benefits of the procedure and the sedation                            options and risks were discussed with the patient.                            All questions were answered, and informed consent                            was obtained. Prior Anticoagulants: The patient has                            taken no anticoagulant or antiplatelet agents. ASA                            Grade Assessment: II - A patient with mild systemic                            disease. After reviewing the risks and benefits,                            the patient was deemed in satisfactory condition to                            undergo the procedure.  After obtaining informed consent, the colonoscope                            was passed under direct vision. Throughout the                            procedure, the patient's blood pressure, pulse, and                            oxygen saturations were monitored continuously. The                            CF [id number] #[id number] was introduced through the anus                            and advanced to  the the cecum, identified by                            appendiceal orifice and ileocecal valve. The                            ileocecal valve, appendiceal orifice, and rectum                            were photographed. The quality of the bowel                            preparation was good. The colonoscopy was performed                            without difficulty. The patient tolerated the                            procedure well. Scope In: 8:15:42 AM Scope Out: 8:31:35 AM Scope Withdrawal Time: 0 hours 12 minutes 52 seconds  Total Procedure Duration: 0 hours 15 minutes 53 seconds  Findings:                 The perianal and digital rectal examinations were                            normal.                           Inflammation characterized by erythema, friability                            and granularity was found as medium patches                            surrounded by normal mucosa in the rectum, in the                            transverse colon and in the ascending colon. The  inflammation was mild in severity, and when                            compared to previous examinations, the findings are                            worsened. Biopsies were taken with a cold forceps                            for histology.                           A 6 mm polyp was found in the sigmoid colon. The                            polyp was sessile. The polyp was removed with a                            cold snare. Resection and retrieval were complete.                           External and internal hemorrhoids were found during                            retroflexion. The hemorrhoids were small and Grade                            I (internal hemorrhoids that do not prolapse).                           The exam was otherwise without abnormality on                            direct and retroflexion views. Complications:            No immediate complications.  Estimated blood loss:                            None. Estimated Blood Loss:     Estimated blood loss: none. Impression:               - Pancolitis ulcerative colitis. Inflammation was                            found in the rectum, in the transverse colon and in                            the ascending colon. This was mild in severity,                            worsened compared to previous examinations.                            Biopsied.                           -  One 6 mm polyp in the sigmoid colon, removed with                            a cold snare. Resected and retrieved.                           - External and internal hemorrhoids.                           - The examination was otherwise normal on direct                            and retroflexion views. Recommendation:           - Repeat colonoscopy date to be determined after                            pending pathology results are reviewed for                            surveillance based on pathology results.                           - Patient has a contact number available for                            emergencies. The signs and symptoms of potential                            delayed complications were discussed with the                            patient. Return to normal activities tomorrow.                            Written discharge instructions were provided to the                            patient.                           - Resume previous diet.                           - Continue present medications.                           - Await pathology results.                           - Lialda 1.2 gm at 2 tabs PO daily indefinitely, 1                            year of refills.                           -  GI office appt 6 months with Dr. Bufford Spikes, MD 08/29/2023 8:37:45 AM This report has been signed electronically.

## 2023-08-30 ENCOUNTER — Telehealth: Payer: Self-pay

## 2023-08-30 NOTE — Telephone Encounter (Signed)
  Follow up Call-     08/29/2023    7:21 AM  Call back number  Post procedure Call Back phone  # [phone]  Permission to leave phone message Yes     Patient questions:  Do you have a fever, pain , or abdominal swelling? No. Pain Score  0 *  Have you tolerated food without any problems? Yes.    Have you been able to return to your normal activities? Yes.    Do you have any questions about your discharge instructions: Diet   No. Medications  No. Follow up visit  No.  Do you have questions or concerns about your Care? No.  Actions: * If pain score is 4 or above: No action needed, pain <4.

## 2023-09-02 ENCOUNTER — Encounter: Payer: Self-pay | Admitting: Gastroenterology

## 2023-09-02 LAB — SURGICAL PATHOLOGY

## 2023-11-21 DIAGNOSIS — M5440 Lumbago with sciatica, unspecified side: Secondary | ICD-10-CM | POA: Diagnosis not present

## 2023-12-06 DIAGNOSIS — E782 Mixed hyperlipidemia: Secondary | ICD-10-CM | POA: Diagnosis not present

## 2023-12-06 DIAGNOSIS — Z0000 Encounter for general adult medical examination without abnormal findings: Secondary | ICD-10-CM | POA: Diagnosis not present

## 2023-12-06 DIAGNOSIS — Z23 Encounter for immunization: Secondary | ICD-10-CM | POA: Diagnosis not present

## 2023-12-06 DIAGNOSIS — Z125 Encounter for screening for malignant neoplasm of prostate: Secondary | ICD-10-CM | POA: Diagnosis not present

## 2023-12-26 DIAGNOSIS — L821 Other seborrheic keratosis: Secondary | ICD-10-CM | POA: Diagnosis not present

## 2023-12-26 DIAGNOSIS — L570 Actinic keratosis: Secondary | ICD-10-CM | POA: Diagnosis not present

## 2024-01-03 DIAGNOSIS — R002 Palpitations: Secondary | ICD-10-CM | POA: Diagnosis not present

## 2024-01-30 DIAGNOSIS — R002 Palpitations: Secondary | ICD-10-CM | POA: Diagnosis not present

## 2024-03-20 DIAGNOSIS — R002 Palpitations: Secondary | ICD-10-CM | POA: Diagnosis not present

## 2024-06-11 DIAGNOSIS — L570 Actinic keratosis: Secondary | ICD-10-CM | POA: Diagnosis not present

## 2024-08-20 DIAGNOSIS — K4090 Unilateral inguinal hernia, without obstruction or gangrene, not specified as recurrent: Secondary | ICD-10-CM | POA: Diagnosis not present

## 2024-08-20 DIAGNOSIS — R509 Fever, unspecified: Secondary | ICD-10-CM | POA: Diagnosis not present

## 2024-08-20 DIAGNOSIS — R0981 Nasal congestion: Secondary | ICD-10-CM | POA: Diagnosis not present

## 2024-08-20 DIAGNOSIS — R051 Acute cough: Secondary | ICD-10-CM | POA: Diagnosis not present

## 2024-08-20 DIAGNOSIS — R222 Localized swelling, mass and lump, trunk: Secondary | ICD-10-CM | POA: Diagnosis not present

## 2024-08-25 DIAGNOSIS — B349 Viral infection, unspecified: Secondary | ICD-10-CM | POA: Diagnosis not present

## 2024-08-25 DIAGNOSIS — R000 Tachycardia, unspecified: Secondary | ICD-10-CM | POA: Diagnosis not present

## 2024-08-25 DIAGNOSIS — R509 Fever, unspecified: Secondary | ICD-10-CM | POA: Diagnosis not present

## 2024-08-25 DIAGNOSIS — R1031 Right lower quadrant pain: Secondary | ICD-10-CM | POA: Diagnosis not present

## 2024-08-26 ENCOUNTER — Encounter: Payer: Self-pay | Admitting: Family Medicine

## 2024-08-26 DIAGNOSIS — R1909 Other intra-abdominal and pelvic swelling, mass and lump: Secondary | ICD-10-CM | POA: Diagnosis not present

## 2024-08-27 ENCOUNTER — Other Ambulatory Visit (HOSPITAL_COMMUNITY): Payer: Self-pay | Admitting: Family Medicine

## 2024-08-27 DIAGNOSIS — R2241 Localized swelling, mass and lump, right lower limb: Secondary | ICD-10-CM

## 2024-08-28 ENCOUNTER — Ambulatory Visit
Admission: RE | Admit: 2024-08-28 | Discharge: 2024-08-28 | Disposition: A | Source: Ambulatory Visit | Attending: Family Medicine | Admitting: Family Medicine

## 2024-08-28 DIAGNOSIS — R2241 Localized swelling, mass and lump, right lower limb: Secondary | ICD-10-CM | POA: Diagnosis not present

## 2024-08-31 ENCOUNTER — Other Ambulatory Visit: Payer: Self-pay | Admitting: Family Medicine

## 2024-08-31 DIAGNOSIS — R599 Enlarged lymph nodes, unspecified: Secondary | ICD-10-CM

## 2024-08-31 DIAGNOSIS — R9389 Abnormal findings on diagnostic imaging of other specified body structures: Secondary | ICD-10-CM

## 2024-08-31 NOTE — Progress Notes (Unsigned)
 Mugweru, Thom, MD  Baldwin Rosella L PROCEDURE / BIOPSY REVIEW Date: 08/31/24  Requested Biopsy site: R groin Reason for request: enlarged LNs Imaging review: Best seen on US  RLE  Decision: Approved Imaging modality to perform: Ultrasound Schedule with: No sedation / Local anesthetic Schedule for: Any VIR  Additional comments: @Schedulers . US  R inguinal LN Bx. Local only  Please contact me with questions, concerns, or if issue pertaining to this request arise.  Thom Hall, MD [facility]       Previous Messages    ----- Message ----- From: Baldwin Rosella CROME Sent: 08/31/2024   1:16 PM EDT To: Rosella CROME Baldwin; Ir Procedure Requests Subject: US  CORE BIOPSY (LYMPH NODES)                  Procedure :US  CORE BIOPSY (LYMPH NODES)  Reason :Dx: Lymph node enlargement [R59.9 (ICD-10-CM)]; Abnormal ultrasound [R93.89 (ICD-10-CM)]  History :US  RT LOWER EXTREM LTD SOFT TISSUE NON VASCULAR,CR DG LUMBAR SPINE COMPLETE 4+V   Provider:  Claudene Pellet, MD  Provider contact ;  [phone]

## 2024-09-04 NOTE — Progress Notes (Signed)
 Patient for US  guided Core RT groin LN Biopsy on Monday 09/07/24, I called and spoke with the patient on the phone and gave pre-procedure instructions. Pt was made aware to be here at 12:30p and check in at the [facility] registration desk. Pt stated understanding. Called 09/01/24

## 2024-09-07 ENCOUNTER — Ambulatory Visit
Admission: RE | Admit: 2024-09-07 | Discharge: 2024-09-07 | Disposition: A | Discharge status: Home / Self Care | Source: Ambulatory Visit | Attending: Family Medicine | Admitting: Family Medicine

## 2024-09-07 DIAGNOSIS — R591 Generalized enlarged lymph nodes: Secondary | ICD-10-CM | POA: Insufficient documentation

## 2024-09-07 DIAGNOSIS — C8590 Non-Hodgkin lymphoma, unspecified, unspecified site: Secondary | ICD-10-CM | POA: Insufficient documentation

## 2024-09-07 DIAGNOSIS — R599 Enlarged lymph nodes, unspecified: Secondary | ICD-10-CM

## 2024-09-07 DIAGNOSIS — R9389 Abnormal findings on diagnostic imaging of other specified body structures: Secondary | ICD-10-CM

## 2024-09-07 MED ORDER — LIDOCAINE HCL (PF) 1 % IJ SOLN
10.0000 mL | Freq: Once | INTRAMUSCULAR | Status: AC
  Administered 2024-09-07: 10 mL
  Filled 2024-09-07: qty 10

## 2024-09-07 NOTE — Procedures (Signed)
Interventional Radiology Procedure Note  Procedure: US Guided Biopsy of right inguinal lymph node  Complications: None  Estimated Blood Loss: < 10 mL  Findings: 18 G core biopsy of right inguinal lymph node performed under US guidance.  Four core samples obtained and sent to Pathology.  Jodi Marble. Fredia Sorrow, M.D Pager:  [phone]

## 2024-09-11 LAB — SURGICAL PATHOLOGY

## 2024-09-14 DIAGNOSIS — Z6831 Body mass index (BMI) 31.0-31.9, adult: Secondary | ICD-10-CM | POA: Diagnosis not present

## 2024-09-14 DIAGNOSIS — K5190 Ulcerative colitis, unspecified, without complications: Secondary | ICD-10-CM | POA: Diagnosis not present

## 2024-09-14 DIAGNOSIS — E669 Obesity, unspecified: Secondary | ICD-10-CM | POA: Diagnosis not present

## 2024-09-30 ENCOUNTER — Inpatient Hospital Stay: Attending: Hematology and Oncology | Admitting: Hematology and Oncology

## 2024-09-30 ENCOUNTER — Inpatient Hospital Stay

## 2024-09-30 VITALS — BP 119/96 | HR 85 | Temp 98.3°F | Resp 14 | Wt 225.7 lb

## 2024-09-30 DIAGNOSIS — R590 Localized enlarged lymph nodes: Secondary | ICD-10-CM | POA: Insufficient documentation

## 2024-09-30 DIAGNOSIS — C9110 Chronic lymphocytic leukemia of B-cell type not having achieved remission: Secondary | ICD-10-CM

## 2024-09-30 DIAGNOSIS — Z86718 Personal history of other venous thrombosis and embolism: Secondary | ICD-10-CM | POA: Diagnosis not present

## 2024-09-30 LAB — CBC WITH DIFFERENTIAL (CANCER CENTER ONLY)
Abs Immature Granulocytes: 0.01 K/uL (ref 0.00–0.07)
Basophils Absolute: 0 K/uL (ref 0.0–0.1)
Basophils Relative: 0 %
Eosinophils Absolute: 0.2 K/uL (ref 0.0–0.5)
Eosinophils Relative: 3 %
HCT: 43.6 % (ref 39.0–52.0)
Hemoglobin: 14.6 g/dL (ref 13.0–17.0)
Immature Granulocytes: 0 %
Lymphocytes Relative: 24 %
Lymphs Abs: 1.7 K/uL (ref 0.7–4.0)
MCH: 28.9 pg (ref 26.0–34.0)
MCHC: 33.5 g/dL (ref 30.0–36.0)
MCV: 86.2 fL (ref 80.0–100.0)
Monocytes Absolute: 0.5 K/uL (ref 0.1–1.0)
Monocytes Relative: 7 %
Neutro Abs: 4.8 K/uL (ref 1.7–7.7)
Neutrophils Relative %: 66 %
Platelet Count: 212 K/uL (ref 150–400)
RBC: 5.06 MIL/uL (ref 4.22–5.81)
RDW: 13 % (ref 11.5–15.5)
WBC Count: 7.2 K/uL (ref 4.0–10.5)
nRBC: 0 % (ref 0.0–0.2)

## 2024-09-30 LAB — CMP (CANCER CENTER ONLY)
ALT: 19 U/L (ref 0–44)
AST: 20 U/L (ref 15–41)
Albumin: 4.5 g/dL (ref 3.5–5.0)
Alkaline Phosphatase: 53 U/L (ref 38–126)
Anion gap: 9 (ref 5–15)
BUN: 15 mg/dL (ref 8–23)
CO2: 27 mmol/L (ref 22–32)
Calcium: 10.2 mg/dL (ref 8.9–10.3)
Chloride: 104 mmol/L (ref 98–111)
Creatinine: 0.82 mg/dL (ref 0.61–1.24)
GFR, Estimated: >60 mL/min (ref >60)
Glucose, Bld: 89 mg/dL (ref 70–99)
Potassium: 4.5 mmol/L (ref 3.5–5.1)
Sodium: 141 mmol/L (ref 135–145)
Total Bilirubin: 0.6 mg/dL (ref 0.0–1.2)
Total Protein: 7.1 g/dL (ref 6.5–8.1)

## 2024-09-30 LAB — LACTATE DEHYDROGENASE: LDH: 166 U/L (ref 105–235)

## 2024-09-30 NOTE — Progress Notes (Signed)
 [facility] [phone]   [phone]  INITIAL CONSULT NOTE  Patient Care Team: Claudene Pellet, MD as PCP - General (Family Medicine) Elana Montie CROME, RN as Oncology Nurse Navigator  Hematological/Oncological History # Chronic Lymphocytic Leukemia Rai Stage I  08/28/2024: US  soft tissue lower extremity showed 2 abnormal lymph nodes within the inguinal region and proximal right thigh corresponding to area of concern 09/07/2024: US  core biopsy showed a CD5/CD20 positive kappa restricted B-cell population, consistent with CLL/SLL 09/30/2024: Establish care with Dr. Federico   CHIEF COMPLAINTS/PURPOSE OF CONSULTATION:  Concern for CLL/SLL  HISTORY OF PRESENTING ILLNESS:  John White 63 y.o. male with medical history significant for mitral valve prolapse, arthritis, and prior DVT who presents for evaluation of a lymph node with findings concerning for a CLL/SLL  On review of the previous records John White underwent a ultrasound of his lower extremity which showed 2 abnormal lymph nodes within the inguinal region and proximal right thigh.  Biopsy performed on 09/07/2024 showed a CD5/CD20 positive kappa restricted B-cell population, consistent with CLL/SLL.  Due to concern for these findings patient was referred to hematology for further evaluation and management.  On exam today John White reports that he had been working on landscaping over the summer and had been having issues with decreased energy.  He is also having some soreness in his right groin.  He reports he also had an episode where he was sick and had a cold which he thought was maybe COVID or the flu and lasted for approximately 12 days.  Initially when he felt this lesion in his right groin he thought it was potentially a hernia.  He notes that he has been using quite a bit of Roundup and may be used 20 gallons over the course of the summer.  He reports that he has never had issues with his  lymph nodes before.  He is not having any issues with fevers, chills, sweats or weight loss.  He denies any bumps or lumps elsewhere such as in the neck or under the arms.  On further discussion he reports that his family history is remarkable for dementia in his mother and colon cancer in his father.  He reports that he underwent a colonoscopy and has to have another 1 done in 3 years.  He reports that he has 2 healthy children.  He has a sister who had strokes and a brother who had leukemia but required no treatment.  He reports that he is a never smoker never drinker and currently is in the oil business.  Overall he feels well and has no additional questions concerns or complaints today.  Full 10 point ROS is otherwise negative.  MEDICAL HISTORY:  Past Medical History:  Diagnosis Date   Arthritis    bilateral hands/fingers   Clotting disorder    hx DVT- arms   Kidney stones    Mitral valve prolapse    Staph infection    finger- treated by dermatologist, no treatment now    SURGICAL HISTORY: Past Surgical History:  Procedure Laterality Date   COLONOSCOPY  2020   MS-MAC-suprep(good)-int hems/minimally active chronic colitis   LAPAROSCOPIC APPENDECTOMY  2008   LITHOTRIPSY  2009   kidney stones   LUMBAR LAMINECTOMY  2003, 2011   POLYPECTOMY     STOMACH SURGERY     when 35 months old for tumor   TONSILLECTOMY  1969    SOCIAL HISTORY: Social History   Socioeconomic History  Marital status: Married    Spouse name: Not on file   Number of children: Not on file   Years of education: Not on file   Highest education level: Not on file  Occupational History   Not on file  Tobacco Use   Smoking status: Never   Smokeless tobacco: Never  Vaping Use   Vaping status: Never Used  Substance and Sexual Activity   Alcohol use: No   Drug use: No   Sexual activity: Yes  Other Topics Concern   Not on file  Social History Narrative   Not on file   Social Drivers of Health    Financial Resource Strain: Not on file  Food Insecurity: No Food Insecurity (09/29/2024)   Hunger Vital Sign    Worried About Running Out of Food in the Last Year: Never true    Ran Out of Food in the Last Year: Never true  Transportation Needs: No Transportation Needs (09/29/2024)   PRAPARE - Administrator, Civil Service (Medical): No    Lack of Transportation (Non-Medical): No  Physical Activity: Not on file  Stress: Not on file  Social Connections: Not on file  Intimate Partner Violence: Not on file    FAMILY HISTORY: Family History  Problem Relation Age of Onset   Colon polyps Father 4   Colon cancer Father 45   Stomach cancer Neg Hx    Esophageal cancer Neg Hx    Rectal cancer Neg Hx     ALLERGIES:  is allergic to augmentin [amoxicillin-pot clavulanate] and doxycycline hyclate.  MEDICATIONS:  Current Outpatient Medications  Medication Sig Dispense Refill   mesalamine  (LIALDA ) 1.2 g EC tablet 2 tablets daily 60 tablet 11   No current facility-administered medications for this visit.    REVIEW OF SYSTEMS:   Constitutional: ( - ) fevers, ( - )  chills , ( - ) night sweats Eyes: ( - ) blurriness of vision, ( - ) double vision, ( - ) watery eyes Ears, nose, mouth, throat, and face: ( - ) mucositis, ( - ) sore throat Respiratory: ( - ) cough, ( - ) dyspnea, ( - ) wheezes Cardiovascular: ( - ) palpitation, ( - ) chest discomfort, ( - ) lower extremity swelling Gastrointestinal:  ( - ) nausea, ( - ) heartburn, ( - ) change in bowel habits Skin: ( - ) abnormal skin rashes Lymphatics: ( - ) new lymphadenopathy, ( - ) easy bruising Neurological: ( - ) numbness, ( - ) tingling, ( - ) new weaknesses Behavioral/Psych: ( - ) mood change, ( - ) new changes  All other systems were reviewed with the patient and are negative.  PHYSICAL EXAMINATION: ECOG PERFORMANCE STATUS: 1 - Symptomatic but completely ambulatory  Vitals:   09/30/24 1259 09/30/24 1300  BP: (!)  144/99 (!) 119/96  Pulse: 85   Resp: 14   Temp: 98.3 F (36.8 C)   SpO2: 97%    Filed Weights   09/30/24 1259  Weight: 225 lb 11.2 oz (102.4 kg)    GENERAL: well appearing middle-age Caucasian male in NAD  SKIN: skin color, texture, turgor are normal, no rashes or significant lesions EYES: conjunctiva are pink and non-injected, sclera clear LYMPH: Palpable right inguinal lymphadenopathy, but otherwise no palpable lymphadenopathy in the cervical, axillary or supraclavicular lymph nodes.  LUNGS: clear to auscultation and percussion with normal breathing effort HEART: regular rate & rhythm and no murmurs and no lower extremity edema Musculoskeletal: no cyanosis  of digits and no clubbing  PSYCH: alert & oriented x 3, fluent speech NEURO: no focal motor/sensory deficits  LABORATORY DATA:  I have reviewed the data as listed    Latest Ref Rng & Units 09/30/2024    2:04 PM 11/02/2010    8:43 AM 03/15/2009    6:05 PM  CBC  WBC 4.0 - 10.5 K/uL 7.2  5.8  7.4   Hemoglobin 13.0 - 17.0 g/dL 85.3  85.1  85.5   Hematocrit 39.0 - 52.0 % 43.6  42.4  42.4   Platelets 150 - 400 K/uL 212  192  212        Latest Ref Rng & Units 09/30/2024    2:04 PM 05/11/2021    2:29 PM  CMP  Glucose 70 - 99 mg/dL 89    BUN 8 - 23 mg/dL 15    Creatinine 9.38 - 1.24 mg/dL 9.17    Sodium 864 - 854 mmol/L 141    Potassium 3.5 - 5.1 mmol/L 4.5    Chloride 98 - 111 mmol/L 104    CO2 22 - 32 mmol/L 27    Calcium 8.9 - 10.3 mg/dL 89.7    Total Protein 6.5 - 8.1 g/dL 7.1  7.3   Total Bilirubin 0.0 - 1.2 mg/dL 0.6  0.6   Alkaline Phos 38 - 126 U/L 53  57   AST 15 - 41 U/L 20  17   ALT 0 - 44 U/L 19  18      PATHOLOGY: linical history: right inguinal lymphadenopathy   DIAGNOSIS:   Lymph node, right inguinal, flow cytometry:  -  CD5/CD200 positive kappa restricted B cells and 4% of the population,  refer to the surgical pathology report [facility] [phone] for further  information    RADIOGRAPHIC  STUDIES: I have personally reviewed the radiological images as listed and agreed with the findings in the report. US  CORE BIOPSY (LYMPH NODES) Result Date: 09/07/2024 INDICATION: Right inguinal lymphadenopathy. EXAM: ULTRASOUND GUIDED CORE BIOPSY OF RIGHT INGUINAL LYMPH NODE MEDICATIONS: None. ANESTHESIA/SEDATION: None PROCEDURE: The procedure, risks, benefits, and alternatives were explained to the patient. Questions regarding the procedure were encouraged and answered. The patient understands and consents to the procedure. A time out was performed prior to initiating the procedure. The right inguinal region was prepped with chlorhexidine in a sterile fashion, and a sterile drape was applied covering the operative field. A sterile gown and sterile gloves were used for the procedure. Local anesthesia was provided with 1% Lidocaine . After isolating an enlarged right inguinal lymph node, core biopsy was performed of the lymph node utilizing a 16 gauge core biopsy device. A total of 4 core biopsy samples were obtained in different portions of the lymph node and placed on saline soaked Telfa gauze. Core samples were sent for pathologic analysis. Additional ultrasound was performed after biopsy. COMPLICATIONS: None immediate. FINDINGS: The largest and morphologically most abnormal appearing lymph node in the right inguinal region measures roughly 4.5 x 1.9 x 2.7 cm. Core biopsy was performed of this lymph node. IMPRESSION: Ultrasound-guided core biopsy performed of an enlarged right inguinal lymph node measuring up to 4.5 cm in maximum length. Electronically Signed   By: Marcey Moan M.D.   On: 09/07/2024 14:29    ASSESSMENT & PLAN John White 63 y.o. male with medical history significant for mitral valve prolapse, arthritis, and prior DVT who presents for evaluation of a lymph node with findings concerning for a CLL/SLL.  After review of  the labs, review of the records, and discussion with the patient  the patients findings are most consistent with SLL/CLL.  # Lymph node biopsy concerning for CLL/ SLL --labs to include CBC, CMP, LDH, and flow cytometry --additional studies to include ESR and CRP  -- Will order a CLL prognostic panel as well as Ig VH somatic hyper mutation testing --If findings of the flow cytometry are less consistent with CLL would recommend consideration of PET CT scan for consideration of excisional biopsy --RTC in 6 months if results are consistent with CLL, can be seen sooner if a more aggressive form of lymphoma is suspected   Orders Placed This Encounter  Procedures   CBC with Differential (Cancer Center Only)    Standing Status:   Future    Number of Occurrences:   1    Expiration Date:   09/30/2025   CMP (Cancer Center only)    Standing Status:   Future    Number of Occurrences:   1    Expiration Date:   09/30/2025   Flow Cytometry, Peripheral Blood (Oncology)    Standing Status:   Future    Number of Occurrences:   1    Expected Date:   09/30/2024    Expiration Date:   09/30/2025   Lactate dehydrogenase (LDH)    Standing Status:   Future    Number of Occurrences:   1    Expiration Date:   09/30/2025   FISH, CLL Prognostic Panel    Standing Status:   Future    Number of Occurrences:   1    Expected Date:   09/30/2024    Expiration Date:   09/30/2025   IgVH Somatic Hypermutation    Standing Status:   Future    Number of Occurrences:   1    Expiration Date:   09/30/2025   FISH CLL Leukemia    Standing Status:   Future    Number of Occurrences:   1    Expiration Date:   09/30/2025    All questions were answered. The patient knows to call the clinic with any problems, questions or concerns.  A total of more than 60 minutes were spent on this encounter with face-to-face time and non-face-to-face time, including preparing to see the patient, ordering tests and/or medications, counseling the patient and coordination of care as outlined above.    Norleen IVAR Kidney, MD Department of Hematology/Oncology [facility] Phone: [phone] Pager: [phone] Email: [email or web address]  10/04/2024 7:56 PM

## 2024-09-30 NOTE — Progress Notes (Signed)
 Appointment with Dr Federico. Met patient and explained my role in his care. Gave my card and clinic information, advised patient to call with any questions or needs he may have.

## 2024-10-01 LAB — FLOW CYTOMETRY

## 2024-10-02 LAB — SURGICAL PATHOLOGY

## 2024-10-13 LAB — FISH HES LEUKEMIA, 4Q12 REA

## 2024-10-14 ENCOUNTER — Encounter: Payer: Self-pay | Admitting: Hematology and Oncology

## 2024-10-14 LAB — IGVH SOMATIC HYPERMUTATION

## 2024-10-17 ENCOUNTER — Other Ambulatory Visit: Payer: Self-pay | Admitting: Hematology and Oncology

## 2024-10-17 DIAGNOSIS — C9110 Chronic lymphocytic leukemia of B-cell type not having achieved remission: Secondary | ICD-10-CM

## 2024-10-29 ENCOUNTER — Ambulatory Visit (HOSPITAL_COMMUNITY)
Admission: RE | Admit: 2024-10-29 | Discharge: 2024-10-29 | Disposition: A | Discharge status: Home / Self Care | Source: Ambulatory Visit | Attending: Hematology and Oncology | Admitting: Hematology and Oncology

## 2024-10-29 DIAGNOSIS — C9110 Chronic lymphocytic leukemia of B-cell type not having achieved remission: Secondary | ICD-10-CM | POA: Insufficient documentation

## 2024-10-29 LAB — GLUCOSE, CAPILLARY: Glucose-Capillary: 99 mg/dL (ref 70–99)

## 2024-10-29 MED ORDER — FLUDEOXYGLUCOSE F - 18 (FDG) INJECTION
11.2500 | Freq: Once | INTRAVENOUS | Status: AC
  Administered 2024-10-29: 15:00:00 11.19 via INTRAVENOUS

## 2024-12-03 ENCOUNTER — Telehealth: Payer: Self-pay | Admitting: Physician Assistant

## 2024-12-03 DIAGNOSIS — K5100 Ulcerative (chronic) pancolitis without complications: Secondary | ICD-10-CM

## 2024-12-03 MED ORDER — MESALAMINE 1.2 G PO TBEC: DELAYED_RELEASE_TABLET | ORAL | 0 refills | Status: DC

## 2024-12-03 NOTE — Telephone Encounter (Signed)
 Good afternoon John White  The following patient needs ta new prescription for mesalamine . He was a patient of Dr. Aneita. He has no issues or complications and is asking to have the appointment virtually since he lives 2 hours from [place].Please advise.

## 2024-12-09 ENCOUNTER — Telehealth: Payer: Self-pay

## 2024-12-09 ENCOUNTER — Telehealth: Payer: Self-pay | Admitting: Physician Assistant

## 2024-12-09 ENCOUNTER — Telehealth: Admitting: Physician Assistant

## 2024-12-09 DIAGNOSIS — Z860100 Personal history of colon polyps, unspecified: Secondary | ICD-10-CM

## 2024-12-09 DIAGNOSIS — Z800 Family history of malignant neoplasm of digestive organs: Secondary | ICD-10-CM

## 2024-12-09 DIAGNOSIS — D3A026 Benign carcinoid tumor of the rectum: Secondary | ICD-10-CM

## 2024-12-09 DIAGNOSIS — D125 Benign neoplasm of sigmoid colon: Secondary | ICD-10-CM | POA: Diagnosis not present

## 2024-12-09 DIAGNOSIS — K5100 Ulcerative (chronic) pancolitis without complications: Secondary | ICD-10-CM

## 2024-12-09 MED ORDER — MESALAMINE 1.2 G PO TBEC: 2.4000 g | DELAYED_RELEASE_TABLET | Freq: Every day | ORAL | 3 refills | Status: AC

## 2024-12-09 NOTE — Progress Notes (Addendum)
 "    12/09/2024 John White [id number] 1961/03/14  Referring provider: Claudene Pellet, MD Primary GI doctor: Dr. Suzann (Dr. Aneita)     12/09/2024 John White [id number] Jun 13, 1961  This service was provided via telemedicine with audio communication. The patient was located at home The provider was located in provider's GI office. The patient did consent to this telephone visit and is aware of possible charges through their insurance for this visit.   The persons participating in this telemedicine service were the patient and I. Time spent on call: 25 mins  I verified that I am speaking with the correct person using two identifiers.  I discussed the limitations, risks, security and privacy concerns of performing an evaluation and management service by telephone and the availability of in person appointments. I also discussed with the patient that there may be a charge for the services provided today billed to their insurance including co-pays and deductible . The patient expressed understanding and agreed to proceed.   ASSESSMENT AND PLAN:  Pan ulcerative colitis Colonoscopy 08/2023 with moderate active colitis Chronic ulcerative pancolitis with mild symptoms due to inconsistent mesalamine  use. Increased colorectal cancer risk from chronic inflammation and family history. - Refilled Lialda  (mesalamine ) for 90-day supply with annual refills, sent to [facility] in [place]. - Instructed to resume Lialda  at 2 tablets daily, emphasized adherence to control symptoms and reduce cancer risk. - Ordered inflammatory markers (sed rate, CRP, fecal calprotectin) after 4-8 weeks of consistent Lialda  use; orders sent to [facility] near [place], [place]. - Advised to contact office for worsening symptoms, including increased hematochezia or diarrhea. - Discussed possible escalation of Lialda  to 4 tablets daily if inflammatory markers remain elevated. -If inflammatory markers are elevated  consider getting hepatitis B surface antigen and TB Gold concerning sputum escalation of therapy -Patient up-to-date on vaccinations - follow up 1 year  CLL Follows with Dr. Norleen Kidney PET scan 03/2024 positive inguinal node  History of DVT and portal vein thrombosis  History of rectal carcinoid 1998  Family history of colon cancer father age 55 Recall colonoscopy 08/28/2026  I have reviewed the clinic note as outlined by by Alan Coombs, PA and agree with the assessment, plan and medical decision making.  Mr. Kuenzel returns to the office today for follow-up of a history of pan ulcerative colitis diagnosed at age 13 currently maintained on Lialda  2.4 g daily.  Has intermittent active symptoms that could be due to medication noncompliance.  Agree with ensuring compliance with medication and consideration of dose escalation to 4.8 g daily if symptoms persist despite moderate mesalamine  dosing.  Last colonoscopy 2024 showed scattered inflammation in the colon as well as a hyperplastic polyp.  There is a pertinent family history of colorectal cancer in the patient's father at age 74.  Due for restaging/surveillance colonoscopy or sooner should symptoms warrant.  Labs 09/2024 showed normal CBC and CMP.  John Suzann, MD   Patient Care Team: Claudene Pellet, MD as PCP - General (Family Medicine) Elana Montie CROME, RN as Oncology Nurse Navigator  HISTORY OF PRESENT ILLNESS: 64 y.o. male presents for evaluation of UC. Last seen in the office on 05/11/2021 by Nyle Claudene, NP.  Current History Discussed the use of AI scribe software for clinical note transcription with the patient, who gave verbal consent to proceed.  History of Present Illness   John White is a 64 year old male with ulcerative pancolitis who presents for follow-up of his colitis and medication  management.  Ulcerative colitis was diagnosed at age 33 and has been managed primarily with mesalamine  (Lialda ),  previously at two pills twice daily and more recently at two pills daily. He has not used injectables or sulfasalazine. Adherence to his medication regimen has been inconsistent, particularly recently while awaiting a prescription refill. When taking Lialda  regularly, symptoms are minimal.  He experiences intermittent episodes of rectal discharge, including bleeding and mucus, typically lasting a few days to a week and resolving for several months. Currently, he is having mild discharge and bleeding, which he attributes to lapses in medication use. No diarrhea except with dietary indiscretion. No rectal pain, burning, or itching. No joint pain, rashes (other than dry skin above the ankles), or changes in vision. Back surgery in 2003 was followed by steroid treatment for colitis symptoms, which have remained minimal over the past 20 years.  Colonoscopy surveillance has been performed regularly, initially every ten years, then every five years, and every three years following his father's death from colon cancer. The most recent colonoscopy in 2024 showed moderately active colitis in the right colon and minimally active chronic colitis in the rectum. Next colonoscopy is due in October 2027.  He recently underwent a lymph node biopsy that was reportedly positive for leukemia, with a subsequent PET CT showing only a mildly hypermetabolic right inguinal lymph node. No further symptoms or follow-up since the scan. Blood work is scheduled for Mar 19, 2025.  He feels well overall, denies weight loss, fevers, or chills, and is preparing to turn 64. Annual flu vaccinations and a shingles vaccine have been received. Describes dry skin above the ankles, attributed to soap or age, and occasionally uses lotion for relief.       Inflammatory Bowel Disease History  Last colonoscopy:  08/29/2023 colonoscopy good prep showed pan ulcerative colitis rectum transverse colon ascending colon mild in severity worsening.   Previous examinations, 6 mm polyp sigmoid colon external/internal hemorrhoids Pathology moderately active chronic colitis negative for dysplasia on the right colon, minimally active chronic colitis negative for dysplasia in the rectum, HP polyp. Recall 08/2026 Last endoscopy: never Last Abd CT/CTE/MRE:  10/29/2024 PET scan for hematological malignancy No abnormal hypermetabolic activity within the liver, pancreas, adrenal glands, or spleen. No hypermetabolic lymph nodes in the abdomen or pelvis. The spleen is normal in size. No splenic hypermetabolism. The enlarged right inguinal node that was biopsied measures approximately 2 cm and SUV max is 3.8. There is also a smaller right inguinal node measuring 9 mm with SUV max of 1.5. Extraintestinal manifestations: The patient has not had any extraintestinal symptoms Surgical history: no surgery  IBD Medication History Diagnosed age of 66 Previously on Lialda  until 2020 Restarted 2022 with mesalamine  1.2 g 2 tablets daily for flare Colonoscopy 2022  moderately active chronic colitis negative for dysplasia on the right colon, minimally active chronic colitis negative for dysplasia in the rectum Admits to not taking lialda  regularly, worsening symptoms when he is not on it twice a day  Review of Data  The following data was reviewed at the time of this encounter:  IBD Labs   Inflammatory markers    Latest Ref Rng & Units 05/14/2007   12:35 05/11/2021   14:29  Inflammatory Markers  Sed Rate 0 - 20 mm/hr 5    CRP 0.5 - 20.0 mg/dL  1.3     Prebiologic Labs Consider getting   Therapeutic Drug Monitoring   Laboratory Studies   Micronutrient evaluation: None  Health Maintenance:    -  DEXA: consider  Vaccinations:  - Annual Flu Vaccine - Will get flu shot annually - Pneumococcal Vaccine (PCV 20)UTD - Zoster vaccine : UTD  RELEVANT GI HISTORY, LABS, IMAGING: Colonoscopy 12/22/2018: Patchy mucosal changes were found in the  rectum. Biopsied. - Internal hemorrhoids. - The examination was otherwise normal on direct and retroflexion views. - Repeat colonoscopy in 3 years 1. Surgical [P], random sites right colon - MINIMALLY ACTIVE CHRONIC COLITIS, CONSISTENT WITH INFLAMMATORY BOWEL DISEASE. - THERE IS NO EVIDENCE OF DYSPLASIA OR MALIGNANCY. 2. Surgical [P], random sites - BENIGN COLONIC MUCOSA. - NO SIGNIFICANT INFLAMMATION OR OTHER ABNORMALITIES IDENTIFIED.  CBC    Component Value Date/Time   WBC 7.2 09/30/2024 1404   WBC 5.8 11/02/2010 0843   RBC 5.06 09/30/2024 1404   HGB 14.6 09/30/2024 1404   HGB 15.1 05/14/2007 1235   HCT 43.6 09/30/2024 1404   HCT 43.1 05/14/2007 1235   PLT 212 09/30/2024 1404   PLT 260 05/14/2007 1235   MCV 86.2 09/30/2024 1404   MCV 86.6 05/14/2007 1235   MCH 28.9 09/30/2024 1404   MCHC 33.5 09/30/2024 1404   RDW 13.0 09/30/2024 1404   RDW 12.9 05/14/2007 1235   LYMPHSABS 1.7 09/30/2024 1404   LYMPHSABS 1.6 05/14/2007 1235   MONOABS 0.5 09/30/2024 1404   MONOABS 0.6 05/14/2007 1235   EOSABS 0.2 09/30/2024 1404   EOSABS 0.1 05/14/2007 1235   BASOSABS 0.0 09/30/2024 1404   BASOSABS 0.0 05/14/2007 1235   Recent Labs    09/30/24 1404  HGB 14.6    CMP     Component Value Date/Time   NA 141 09/30/2024 1404   K 4.5 09/30/2024 1404   CL 104 09/30/2024 1404   CO2 27 09/30/2024 1404   GLUCOSE 89 09/30/2024 1404   BUN 15 09/30/2024 1404   CREATININE 0.82 09/30/2024 1404   CALCIUM 10.2 09/30/2024 1404   PROT 7.1 09/30/2024 1404   ALBUMIN 4.5 09/30/2024 1404   AST 20 09/30/2024 1404   ALT 19 09/30/2024 1404   ALKPHOS 53 09/30/2024 1404   BILITOT 0.6 09/30/2024 1404   GFRNONAA >60 09/30/2024 1404      Latest Ref Rng & Units 09/30/2024    2:04 PM 05/11/2021    2:29 PM  Hepatic Function  Total Protein 6.5 - 8.1 g/dL 7.1  7.3   Albumin 3.5 - 5.0 g/dL 4.5  4.3   AST 15 - 41 U/L 20  17   ALT 0 - 44 U/L 19  18   Alk Phosphatase 38 - 126 U/L 53  57   Total  Bilirubin 0.0 - 1.2 mg/dL 0.6  0.6   Bilirubin, Direct 0.0 - 0.3 mg/dL  0.1       Current Medications:   Current Outpatient Medications (Other):    mesalamine  (LIALDA ) 1.2 g EC tablet, Take 2 tablets (2.4 g total) by mouth daily with breakfast. 2 tablets daily  Medical History:  Past Medical History:  Diagnosis Date   Arthritis    bilateral hands/fingers   Clotting disorder    hx DVT- arms   Kidney stones    Mitral valve prolapse    Staph infection    finger- treated by dermatologist, no treatment now   Allergies: Allergies[1]   Surgical History:  He  has a past surgical history that includes Lumbar laminectomy (2003, 2011); Laparoscopic appendectomy (2008); Stomach surgery; Lithotripsy (2009); Colonoscopy (2020); Polypectomy; and Tonsillectomy (1969). Family History:  His family history includes Colon cancer (age of onset:  53) in his father; Colon polyps (age of onset: 29) in his father.  REVIEW OF SYSTEMS  : All other systems reviewed and negative except where noted in the History of Present Illness.  PHYSICAL EXAM: There were no vitals taken for this visit. General Appearance:Well sounding, in no apparent distress.  ENT/Mouth: No hoarseness, No cough for duration of visit.  Respiratory: completing full sentences without distress, without audible wheeze Neuro: Awake and oriented X 3,  Psych:  Insight and Judgment appropriate.    Alan JONELLE Coombs, PA-C 1:58 PM       [1]  Allergies Allergen Reactions   Augmentin [Amoxicillin-Pot Clavulanate] Rash   Doxycycline Hyclate Palpitations   "

## 2024-12-09 NOTE — Telephone Encounter (Signed)
 Mr. John White, grape are scheduled for a virtual visit.  Just as we do with appointments in the office, we must obtain your consent to participate.  Your consent will be active for this visit and any virtual visit you may have with one of our providers in the next 365 days.    If you have a MyChart account, I can also send a copy of this consent to you electronically.  All virtual visits are billed to your insurance company just like a traditional visit in the office.  As this is a virtual visit, video technology does not allow for your provider to perform a traditional examination.  This may limit your provider's ability to fully assess your condition.  If your provider identifies any concerns that need to be evaluated in person or the need to arrange testing such as labs, EKG, etc, we will make arrangements to do so.    Although advances in technology are sophisticated, we cannot ensure that it will always work on either your end or our end.  If the connection with a video visit is poor, we may have to switch to a telephone visit.  With either a video or telephone visit, we are not always able to ensure that we have a secure connection.   I need to obtain your verbal consent now.    Are you willing to proceed with your visit today?  Response: Yes  Alan JONELLE Coombs, PA-C 12/09/2024  1:29 PM

## 2024-12-09 NOTE — Telephone Encounter (Signed)
 Spoke with patient and informed him that I am going to mail his lab requisitions so he can get his labs drawn at a [facility] lab near him. Patient verbalized understanding. Will send reminder to myself to make sure we receive labs.

## 2025-03-31 ENCOUNTER — Inpatient Hospital Stay

## 2025-03-31 ENCOUNTER — Inpatient Hospital Stay: Admitting: Hematology and Oncology
